# Patient Record
Sex: Female | Born: 1955 | Race: Black or African American | Hispanic: No | Smoking: Former smoker
Health system: Southern US, Community
[De-identification: ages and names within clinical notes are randomized; demographics above are authoritative.]

## PROBLEM LIST (undated history)

## (undated) DIAGNOSIS — M1990 Unspecified osteoarthritis, unspecified site: Secondary | ICD-10-CM

## (undated) HISTORY — DX: Unspecified osteoarthritis, unspecified site: M19.90

## (undated) HISTORY — PX: APPENDECTOMY: SHX54

## (undated) HISTORY — PX: CHOLECYSTECTOMY: SHX55

## (undated) HISTORY — PX: GASTRIC BYPASS: SHX52

---

## 1998-02-27 ENCOUNTER — Ambulatory Visit: Admission: RE | Admit: 1998-02-27 | Discharge: 1998-02-27 | Payer: Self-pay | Admitting: Internal Medicine

## 2001-06-21 ENCOUNTER — Encounter: Payer: Self-pay | Admitting: Internal Medicine

## 2001-06-21 ENCOUNTER — Ambulatory Visit (HOSPITAL_COMMUNITY): Admission: RE | Admit: 2001-06-21 | Discharge: 2001-06-21 | Payer: Self-pay | Admitting: Internal Medicine

## 2002-01-15 ENCOUNTER — Other Ambulatory Visit: Admission: RE | Admit: 2002-01-15 | Discharge: 2002-01-15 | Payer: Self-pay | Admitting: Internal Medicine

## 2002-12-24 ENCOUNTER — Ambulatory Visit (HOSPITAL_COMMUNITY): Admission: RE | Admit: 2002-12-24 | Discharge: 2002-12-24 | Payer: Self-pay | Admitting: Internal Medicine

## 2002-12-25 ENCOUNTER — Encounter: Payer: Self-pay | Admitting: Internal Medicine

## 2002-12-25 ENCOUNTER — Ambulatory Visit (HOSPITAL_COMMUNITY): Admission: RE | Admit: 2002-12-25 | Discharge: 2002-12-25 | Payer: Self-pay | Admitting: Internal Medicine

## 2003-01-08 ENCOUNTER — Encounter: Payer: Self-pay | Admitting: Internal Medicine

## 2003-01-08 ENCOUNTER — Ambulatory Visit (HOSPITAL_COMMUNITY): Admission: RE | Admit: 2003-01-08 | Discharge: 2003-01-08 | Payer: Self-pay | Admitting: Internal Medicine

## 2003-10-22 ENCOUNTER — Ambulatory Visit (HOSPITAL_COMMUNITY): Admission: RE | Admit: 2003-10-22 | Discharge: 2003-10-22 | Payer: Self-pay | Admitting: General Surgery

## 2003-11-03 ENCOUNTER — Encounter: Admission: RE | Admit: 2003-11-03 | Discharge: 2004-02-01 | Payer: Self-pay | Admitting: General Surgery

## 2003-11-05 ENCOUNTER — Encounter: Admission: RE | Admit: 2003-11-05 | Discharge: 2003-11-05 | Payer: Self-pay | Admitting: General Surgery

## 2003-11-12 ENCOUNTER — Encounter: Payer: Self-pay | Admitting: Cardiovascular Disease

## 2003-11-12 ENCOUNTER — Ambulatory Visit (HOSPITAL_COMMUNITY): Admission: RE | Admit: 2003-11-12 | Discharge: 2003-11-12 | Payer: Self-pay | Admitting: General Surgery

## 2004-02-25 ENCOUNTER — Encounter: Admission: RE | Admit: 2004-02-25 | Discharge: 2004-05-25 | Payer: Self-pay | Admitting: General Surgery

## 2004-05-10 ENCOUNTER — Encounter (INDEPENDENT_AMBULATORY_CARE_PROVIDER_SITE_OTHER): Payer: Self-pay | Admitting: Specialist

## 2004-05-10 ENCOUNTER — Inpatient Hospital Stay (HOSPITAL_COMMUNITY): Admission: RE | Admit: 2004-05-10 | Discharge: 2004-05-13 | Payer: Self-pay | Admitting: General Surgery

## 2004-08-05 ENCOUNTER — Encounter: Admission: RE | Admit: 2004-08-05 | Discharge: 2004-08-05 | Payer: Self-pay | Admitting: Internal Medicine

## 2004-10-13 ENCOUNTER — Encounter: Admission: RE | Admit: 2004-10-13 | Discharge: 2005-01-11 | Payer: Self-pay | Admitting: General Surgery

## 2004-11-02 ENCOUNTER — Ambulatory Visit: Admission: RE | Admit: 2004-11-02 | Discharge: 2004-11-25 | Payer: Self-pay | Admitting: Radiation Oncology

## 2004-12-15 ENCOUNTER — Ambulatory Visit: Admission: RE | Admit: 2004-12-15 | Discharge: 2004-12-15 | Payer: Self-pay | Admitting: Radiation Oncology

## 2007-05-16 ENCOUNTER — Ambulatory Visit (HOSPITAL_COMMUNITY): Admission: RE | Admit: 2007-05-16 | Discharge: 2007-05-16 | Payer: Self-pay | Admitting: Internal Medicine

## 2010-11-14 ENCOUNTER — Encounter: Payer: Self-pay | Admitting: General Surgery

## 2011-03-11 NOTE — Op Note (Signed)
NAME:  [name] Ray, [name] Ray                          ACCOUNT NO.:  [id number]   MEDICAL RECORD NO.:  [id number]                   PATIENT TYPE:  INP   LOCATION:  0164                                 FACILITY:  [facility]   PHYSICIAN:  Sandria Bales. Ezzard Standing, M.D.               DATE OF BIRTH:  [birth year]   DATE OF PROCEDURE:  05/10/2004  DATE OF DISCHARGE:                                 OPERATIVE REPORT   PREOPERATIVE DIAGNOSIS:  Morbid obesity, status post laparoscopic Roux-Y  gastrojejunostomy.   POSTOPERATIVE DIAGNOSIS:  Morbid obesity, status post laparoscopic Roux-Y  gastrojejunostomy normal anastomosis with no evidence of leak.   PROCEDURE:  Esophagogastroscopy.   SURGEON:  Dr. Ezzard Standing   FIRST ASSISTANT:  None.   ANESTHESIA:  General endotracheal.   INDICATION FOR PROCEDURE:  [name] is a 55 year old white female, who is  a patient of Dr. Jamse Mead, who has a weight of 351 pounds, a BMI of 53,  who now comes for a laparoscopic Roux-en-Y gastrojejunostomy.  Dr. Johna Sheriff  has completed the laparoscopic portion of this operation.  I am now doing an  upper endoscopy to document the patency anastomosis to evaluate that there  is no leak and no bleeding.   DESCRIPTION OF PROCEDURE:  While Dr. Johna Sheriff manned the laparoscope intra-  abdominally, I passed an Olympus endoscope through the patient's mouth  without difficulty, went down the esophagus into the gastric pouch.  The  anastomosis is at about 42-43 cm.  The Z-line is at about 37-38 cm for about  a 4-5 cm pouch.  While I was endoscoping the patient, I insufflated air.  He  had clamped off the small bowel, and there was no evidence of any leak.  I  then took a picture of the anastomosis.  Initially there was no bleeding.  I  irrigated out the stomach and then removed the endoscope, and the remainder  of the stomach pouch and the esophagus were otherwise unremarkable.   The patient tolerated the procedure well.  Dr. Johna Sheriff will dictate  the  remainder of this operation.                                               Sandria Bales. Ezzard Standing, M.D.    DHN/MEDQ  D:  05/10/2004  T:  05/10/2004  Job:  [id number]   cc:   Lorne Skeens. Hoxworth, M.D.  [address], [address]  [place]  [place] [address]  Fax: [phone]

## 2011-03-11 NOTE — Discharge Summary (Signed)
NAME:  Ray, [name]                          ACCOUNT NO.:  [id number]   MEDICAL RECORD NO.:  [id number]                   PATIENT TYPE:  INP   LOCATION:  0164                                 FACILITY:  [facility]   PHYSICIAN:  Sharlet Salina T. Hoxworth, M.D.          DATE OF BIRTH:  01/31/1956   DATE OF ADMISSION:  05/10/2004  DATE OF DISCHARGE:  05/13/2004                                 DISCHARGE SUMMARY   DISCHARGE DIAGNOSES:  1. Morbid obesity.  2. Cholelithiasis.   OPERATIONS/PROCEDURES:  1. Laparoscopic Roux-en-Y gastric bypass, May 10, 2004.  2. Laparoscopic cholecystectomy, May 10, 2004.   HISTORY OF PRESENT ILLNESS:  [name] Ray is a 55 year old black female with  longstanding morbid obesity unresponsive to medical management.  She has a  diagnosis of sleep apnea.  After extensive outpatient consultation and  workup, we have elected to proceed with laparoscopic Roux-en-Y gastric  bypass for treatment of obesity.  Preoperative workup also indicates  cholelithiasis and a cholecystectomy is planned.   PAST MEDICAL HISTORY:  Surgical history includes:  1. Appendectomy.  2. Tubal ligation.  She is followed for:  1. Elevated cholesterol, not on medications.  2. Uses CPAP intermittently for sleep apnea.  3. History of depression.   She is on Wellbutrin XL, as her only medication.   No known drug allergies.   Social history, family history, review of systems see H&P.   PERTINENT PHYSICAL EXAMINATION:  VITAL SIGNS:  Weight is 367 pounds, height  5'7, BMI 58, abdominal girth 137-cm.  Vital signs were unremarkable.  GENERAL:  Physical examination was remarkable only for morbid obesity.   HOSPITAL COURSE:  The patient was admitted on the morning of her procedures  and underwent an uneventful laparoscopic Roux-en-Y gastric bypass and  cholecystectomy.  Postoperatively, she did very well.  Gastrografin swallow  on the first postoperative day was negative for leak of obstruction.  Doppler exam of the lower extremities was negative.  She was begun on small  amounts of clear liquids which were tolerated well.  Was advanced to  unlimited clear liquids on the second postoperative day, also tolerated  well.  At this time, her white count is 11,000, hemoglobin was 11.8.  She  was advanced to a full liquid diet, on May 13, 2004, and tolerated this  without difficulty.  She is discharged home at this time.  Abdomen was  benign.  She has a small amount of serosanguineous JP drainage.   She is on a full liquid diet per nutrition instructions.   She was given a prescription for Roxicet elixir for pain.   Followup is in my office in one week.                                               Sharlet Salina  Lurlean Leyden, M.D.    Tory Emerald  D:  05/18/2004  T:  05/18/2004  Job:  [id number]

## 2011-03-11 NOTE — Op Note (Signed)
NAME:  [name] Ray, [name] Ray                          ACCOUNT NO.:  [id number]   MEDICAL RECORD NO.:  [id number]                   PATIENT TYPE:  INP   LOCATION:  0164                                 FACILITY:  [facility]   PHYSICIAN:  Sharlet Salina T. Hoxworth, M.D.          DATE OF BIRTH:  January 13, 1956   DATE OF PROCEDURE:  05/10/2004  DATE OF DISCHARGE:                                 OPERATIVE REPORT   PREOPERATIVE DIAGNOSES:  1. Morbid obesity.  2. Cholelithiasis.   SURGICAL PROCEDURES:  1. Laparoscopic Roux-Y gastric bypass.  2. Laparoscopic cholecystectomy with intraoperative cholangiogram.   SURGEON:  Sharlet Salina T. Hoxworth, M.D.   ASSISTANT:  Sandria Bales. Ezzard Standing, M.D.   ANESTHESIA:  General.   BRIEF HISTORY AND PHYSICAL:  [name] Ray is a 55 year old white female with  longstanding morbid obesity unresponsive to medical management.  She has  comorbidities of sleep apnea, hypercholesterolemia, and early hypertension.  After extensive discussion of options and preoperative workup, we have  elected to proceed with laparoscopic Roux-Y gastric bypass and treatment for  obesity.  On preoperative workup, she was also noted to have multiple  gallstones, and cholecystectomy with cholangiogram has been recommended and  accepted.  The patient is now brought to the operating room for this  procedure.   DESCRIPTION OF OPERATION:  The patient was brought to the operating room and  placed in the supine position on the operating table.  General endotracheal  anesthesia was induced.  The PS's were in place.  She received perioperative  antibiotics and subcutaneous Lovenox 40 mg.  She had undergone a mechanical  antibiotic bowel prep preoperatively.  The abdomen was widely sterilely  prepped and draped.  Local anesthesia was used to infiltrate the trocar  sites prior to the incisions.  Access was obtained through a 12-mm port at  the left subcostal margin using the Optivue trocar without difficulty, and  pneumoperitoneum established.  Under direct vision, a 12-mm port was placed  in the right upper quadrant through the falciform ligament, the right  midabdomen, and the left midabdomen near the midline, and a 5-mm port was  placed in the left flank.  The omentum and transverse mesocolon were  elevated and the ligament of Treitz identified.  A 40-cm afferent limb was  measured.  At this point, the bowel was divided with a single firing of the  Endo GIA vascular stapler.  The mesentery was further mobilized with the  Harmonic scalpel.  A Penrose drain was sutured to the end of the efferent  limb for identification.  A 100-cm efferent limb was then measured.  At this  point, a jejunojejunostomy was created making enterotomies with the Harmonic  scalpel and using a single firing of the Endo GIA vascular stapler.  The  staple line was inspected and was without bleeding and intact.  The common  enterotomy was then closed with running 2-0 Vicryl begun at either end of  the enterotomy and tied centrally.  The mesenteric defect behind the  afferent limb was then exposed, and this was closed with a running 2-0 silk  suture working out onto the jejunojejunostomy with a nice closure of the  mesenteric defect.  Tisseal tissue seal was then placed over the staple and  suture lines.  Following this, the patient was placed in the steep reverse  Trendelenburg and through a 5-mm trocar site, the [facility] liver retractor  placed and the left lobe of the liver elevated.  The hiatus and upper  stomach were well visualized.  The peritoneum overlying the angle of His was  incised, and the angle of His was mobilized down along the left crus toward  the lesser sac.  All tubes were then removed from the stomach.  A 4-5 cm  pouch was then measured along the lesser curve, and at this point, the  peritoneum along the lesser curve was incised, and dissection was carried  along right angles close to the stomach wall  toward the lesser sac, and the  lesser sac entered with minimal dissection.  An initial firing of the Endo  GIA blue load stapler was performed at right angles across the stomach at  this point.  A tubular pouch was then created with three more firings of the  stapler to and through the angle of His with the last firing being performed  with the Ewald tube through the EG junction and freely movable.  The pouch  was seen to be completely freed from the remnant.  Tisseal tissue seal was  placed on the upper staple line at the pouch near the angle of His and along  the remnant staple line.  The efferent limb was then brought up for  anastomosis and came up nicely in an antecolic position without any undue  tension.  An anastomosis was created between the efferent limb and the  gastric pouch with an initial running posterior row of 2-0 Vicryl.  With the  Ewald tube advanced back into the pouch, enterotomies were created in the  pouch and in the efferent limb with the Harmonic scalpel and an anastomosis  created approximately 2 cm in length with a single firing of the Endo GIA  blue load stapler.  The staple line was inspected, appeared intact, and  without bleeding.  The common enterotomy was then closed from either end  with running 2-0 Vicryl.  The Ewald tube was then passed through the  anastomosis into the efferent limb, and an outer anterior seromuscular row  of 2-0 Vicryl was placed.  Following this, endoscopy was performed by Dr.  Ezzard Standing with the efferent limb clamped near the pouch, and with the pouch  under saline, and with it tightly distended with air, there was no evidence  of leak.  The air was evacuated and saline suctioned.  Tisseal tissue seal  was placed over the staple and suture lines around the gastrojejunostomy.  Attention at this point was turned to the cholecystectomy.  The epigastric  port was redirected to the right of the falciform ligament.  Two additional 5-mm  trocars were placed on the right subcostal margin.  The gallbladder was  identified and the fundus elevated up over the liver and the infundibulum  retracted inferolaterally.  The fibrofatty tissue was stripped off the neck  of the gallbladder toward the porta hepatis, and the peritoneum anterior and  posterior to Calot's triangle was incised.  The distal gallbladder was  thoroughly dissected,  and Calot's triangle skeletonized.  The cystic artery  was clearly identified coursing over the gallbladder wall.  It was divided  between two proximal and one distal clip.  The cystic duct/gallbladder  junction was dissected 360 degrees.  The cystic duct was then clipped at the  gallbladder junction.  Operative cholangiogram was obtained through the  cystic duct which showed good filling of a normal common bile duct and  intrahepatic ducts and free flow into the duodenum with no filling defects.  Following this, the catheter was removed.  The cystic duct was doubly  clipped proximally and divided.  The gallbladder was then dissected free  from its bed using Hook cautery.  It was placed in an EndoCatch bag and then  brought up to one of the 12-mm trocar sites where the contents were removed  and the gallbladder removed.  The abdomen was irrigated and complete  hemostasis assured.  A closed suction drain was left, brought out through  the lateral 5-mm trocar site and placed subhepatic near the  gastrojejunostomy.  Trocars were then removed and all CO2 evacuated.  Skin  incisions were closed with staples.  Sponge, needle, and instruments counts  were correct.  Dry sterile dressings were applied, and the patient was taken  to the recovery room in good condition having tolerated the procedure well.                                               Lorne Skeens. Hoxworth, M.D.    Tory Emerald  D:  05/10/2004  T:  05/10/2004  Job:  [id number]

## 2013-01-05 ENCOUNTER — Ambulatory Visit (INDEPENDENT_AMBULATORY_CARE_PROVIDER_SITE_OTHER): Payer: BC Managed Care – PPO | Admitting: Emergency Medicine

## 2013-01-05 VITALS — BP 162/82 | HR 110 | Temp 98.5°F | Resp 17 | Ht 65.5 in | Wt 315.0 lb

## 2013-01-05 DIAGNOSIS — R21 Rash and other nonspecific skin eruption: Secondary | ICD-10-CM

## 2013-01-05 MED ORDER — TRIAMCINOLONE ACETONIDE 0.1 % EX CREA: TOPICAL_CREAM | Freq: Two times a day (BID) | CUTANEOUS | Status: DC

## 2013-01-05 NOTE — Progress Notes (Signed)
[facility] [address] [place] [address] [phone]  Date:  01/05/2013   Name:  [name] Ray   DOB:  1956-01-20   MRN:  [id number]  PCP:  Alva Garnet., MD    Chief Complaint: Hand Injury   History of Present Illness:  [name] Ray is a 57 y.o. very pleasant female patient who presents with the following:  Rash on 2nd and 3rd MCP on extensor surface for the past month or so.  No history of injury or new allergen contact.  Pruritic.  No improvement with topical home remedies.  Forms keloids.  No history of psoriasis.  No improvement with over the counter medications or other home remedies. Denies other complaint or health concern today.    There is no problem list on file for this patient.   History reviewed. No pertinent past medical history.  Past Surgical History  Procedure Laterality Date  . Appendectomy    . Cholecystectomy      History  Substance Use Topics  . Smoking status: Current Every Day Smoker -- 0.50 packs/day for 20 years    Types: Cigarettes  . Smokeless tobacco: Not on file  . Alcohol Use: 0.6 oz/week    1 Glasses of wine per week    Family History  Problem Relation Age of Onset  . Lupus Sister     Not on File  Medication list has been reviewed and updated.  No current outpatient prescriptions on file prior to visit.   No current facility-administered medications on file prior to visit.    Review of Systems:  As per HPI, otherwise negative.    Physical Examination: Filed Vitals:   01/05/13 1737  BP: 162/82  Pulse: 110  Temp: 98.5 F (36.9 C)  Resp: 17   Filed Vitals:   01/05/13 1737  Height: 5' 5.5" (1.664 m)  Weight: 315 lb (142.883 kg)   Body mass index is 51.6 kg/(m^2). Ideal Body Weight: Weight in (lb) to have BMI = 25: 152.2   GEN: WDWN, NAD, Non-toxic, Alert & Oriented x 3 HEENT: Atraumatic, Normocephalic.  Ears and Nose: No external deformity. EXTR: No  clubbing/cyanosis/edema NEURO: Normal gait.  PSYCH: Normally interactive. Conversant. Not depressed or anxious appearing.  Calm demeanor.  SKIN:  Scaly eruption on second and third MCP joints left hand  No erythema or cellulitis or lymphangitis.  No involvement of joints.  Assessment and Plan: Possible psoriasis TAC Follow up with dermatology if no improvement in 2 weeks  Carmelina Dane, MD

## 2013-01-05 NOTE — Patient Instructions (Addendum)
Psoriasis Psoriasis is a common, long-lasting (chronic) inflammation of the skin. It affects both men and women equally, of all ages and all races. Psoriasis cannot be passed from person to person (not contagious). Psoriasis varies from mild to very severe. When severe, it can greatly affect your quality of life. Psoriasis is an inflammatory disorder affecting the skin as well as other organs including the joints (causing an arthritis). With psoriasis, the skin sheds its top layer of cells more rapidly than it does in someone without psoriasis. CAUSES  The cause of psoriasis is largely unknown. Genetics, your immune system, and the environment seem to play a role in causing psoriasis. Factors that can make psoriasis worse include:  Damage or trauma to the skin, such as cuts, scrapes, and sunburn. This damage often causes new areas of psoriasis (lesions).  Winter dryness and lack of sunlight.  Medicines such as lithium, beta-blockers, antimalarial drugs, ACE inhibitors, nonsteroidal anti-inflammatory drugs (ibuprofen, aspirin), and terbinafine. Let your caregiver know if you are taking any of these drugs.  Alcohol. Excessive alcohol use should be avoided if you have psoriasis. Drinking large amounts of alcohol can affect:  How well your psoriasis treatment works.  How safe your psoriasis treatment is.  Smoking. If you smoke, ask your caregiver for help to quit.  Stress.  Bacterial or viral infections.  Arthritis. Arthritis associated with psoriasis (psoriatic arthritis) affects less than 10% of patients with psoriasis. The arthritic intensity does not always match the skin psoriasis intensity. It is important to let your caregiver know if your joints hurt or if they are stiff. SYMPTOMS  The most common form of psoriasis begins with little red bumps that gradually become larger. The bumps begin to form scales that flake off easily. The lower layers of scales stick together. When these scales  are scratched or removed, the underlying skin is tender and bleeds easily. These areas then grow in size and may become large. Psoriasis often creates a rash that looks the same on both sides of the body (symmetrical). It often affects the elbows, knees, groin, genitals, arms, legs, scalp, and nails. Affected nails often have pitting, loosen, thicken, crumble, and are difficult to treat.  "Inverse psoriasis"occurs in the armpits, under breasts, in skin folds, and around the groin, buttocks, and genitals.  "Guttate psoriasis" generally occurs in children and young adults following a recent sore throat (strep throat). It begins with many small, red, scaly spots on the skin. It clears spontaneously in weeks or a few months without treatment. DIAGNOSIS  Psoriasis is diagnosed by physical exam. A tissue sample (biopsy) may also be taken. TREATMENT The treatment of psoriasis depends on your age, health, and living conditions.  Steroid (cortisone) creams, lotions, and ointments may be used. These treatments are associated with thinning of the skin, blood vessels that get larger (dilated), loss of skin pigmentation, and easy bruising. It is important to use these steroids as directed by your caregiver. Only treat the affected areas and not the normal, unaffected skin. People on long-term steroid treatment should wear a medical alert bracelet. Injections may be used in areas that are difficult to treat.  Scalp treatments are available as shampoos, solutions, sprays, foams, and oils. Avoid scratching the scalp and picking at the scales.  Anthralin medicine works well on areas that are difficult to treat. However, it stains clothes and skin and may cause temporary irritation.  Synthetic vitamin D (calcipotriene)can be used on small areas. It is available by prescription. The forms   of synthetic vitamin D available in health food stores do not help with psoriasis.  Coal tarsare available in various strengths  for psoriasis that is difficult to treat. They are one of the longest used treatments for difficult to treat psoriasis. However, they are messy to use.  Light therapy (UV therapy) can be carefully and professionally monitored in a dermatologist's office. Careful sunbathing is helpful for many people as directed by your caregiver. The exposure should be just long enough to cause a mild redness (erythema) of your skin. Avoid sunburn as this may make the condition worse. Sunscreen (SPF of 30 or higher) should be used to protect against sunburn. Cataracts, wrinkles, and skin aging are some of the harmful side effects of light therapy.  If creams (topical medicines) fail, there are several other options for systemic or oral medicines your caregiver can suggest. Psoriasis can sometimes be very difficult to treat. It can come and go. It is necessary to follow up with your caregiver regularly if your psoriasis is difficult to treat. Usually, with persistence you can get a good amount of relief. Maintaining consistent care is important. Do not change caregivers just because you do not see immediate results. It may take several trials to find the right combination of treatment for you. PREVENTING FLARE-UPS  Wear gloves while you wash dishes, while cleaning, and when you are outside in the cold.  If you have radiators, place a bowl of water or damp towel on the radiator. This will help put water back in the air. You can also use a humidifier to keep the air moist. Try to keep the humidity at about 60% in your home.  Apply moisturizer while your skin is still damp from bathing or showering. This traps water in the skin.  Avoid long, hot baths or showers. Keep soap use to a minimum. Soaps dry out the skin and wash away the protective oils. Use a fragrance free, dye free soap.  Drink enough water and fluids to keep your urine clear or pale yellow. Not drinking enough water depletes your skin's water  supply.  Turn off the heat at night and keep it low during the day. Cool air is less drying. SEEK MEDICAL CARE IF:  You have increasing pain in the affected areas.  You have uncontrolled bleeding in the affected areas.  You have increasing redness or warmth in the affected areas.  You start to have pain or stiffness in your joints.  You start feeling depressed about your condition.  You have a fever. Document Released: 10/07/2000 Document Revised: 01/02/2012 Document Reviewed: 04/04/2011 [employer] Patient Information 2013 [employer].  

## 2016-01-28 ENCOUNTER — Ambulatory Visit (INDEPENDENT_AMBULATORY_CARE_PROVIDER_SITE_OTHER): Payer: BLUE CROSS/BLUE SHIELD | Admitting: Physician Assistant

## 2016-01-28 VITALS — BP 113/77 | HR 83 | Temp 98.0°F | Resp 16 | Ht 65.5 in | Wt 322.0 lb

## 2016-01-28 DIAGNOSIS — Z720 Tobacco use: Secondary | ICD-10-CM

## 2016-01-28 DIAGNOSIS — Z124 Encounter for screening for malignant neoplasm of cervix: Secondary | ICD-10-CM

## 2016-01-28 DIAGNOSIS — Z111 Encounter for screening for respiratory tuberculosis: Secondary | ICD-10-CM

## 2016-01-28 DIAGNOSIS — Z1239 Encounter for other screening for malignant neoplasm of breast: Secondary | ICD-10-CM

## 2016-01-28 DIAGNOSIS — Z0000 Encounter for general adult medical examination without abnormal findings: Secondary | ICD-10-CM | POA: Diagnosis not present

## 2016-01-28 DIAGNOSIS — Z9889 Other specified postprocedural states: Secondary | ICD-10-CM | POA: Diagnosis not present

## 2016-01-28 DIAGNOSIS — Z0184 Encounter for antibody response examination: Secondary | ICD-10-CM

## 2016-01-28 DIAGNOSIS — Z23 Encounter for immunization: Secondary | ICD-10-CM | POA: Diagnosis not present

## 2016-01-28 DIAGNOSIS — Z9884 Bariatric surgery status: Secondary | ICD-10-CM | POA: Insufficient documentation

## 2016-01-28 LAB — HEPATITIS B SURFACE ANTIBODY, QUANTITATIVE: HEPATITIS B-POST: 0.7 m[IU]/mL

## 2016-01-28 LAB — VITAMIN B12

## 2016-01-28 NOTE — Progress Notes (Signed)
[facility] [address], [place] [address] [phone]  Date:  01/28/2016   Name:  [name] InfanteBetty E Ray   DOB:  May 26, 1956   MRN:  [id number]  PCP:  No primary care provider on file.    Chief Complaint: Annual Exam   History of Present Illness:  This is a 60 y.o. female with PMH morbid obesity, tobacco abuse who is presenting for CPE.   Takes phentermine 37.5 mg daily. Gets from [facility] Mayford Ray [facility]. Has been taking for 2 weeks now. Has lost 6 pounds so far. States she had blood work 2 weeks ago - CMP, CBC, TSH normal, lipid (states HDL was around 45 and LDL around 140). States she wants a vit D, b12 and A1c today.  Smokes 2-3 cigarettes a day. States she is always trying to quit. Only smokes in her bathroom. Never smokes on her 12 hour shift at work.  Had gastric bypass in 2006. Has had cholecystectomy and appendectomy in the past.  Last pap: 2012, never had abnormals. Does want to get a pap smear today. Sexual history: not currently sexually active. Does not want STD testing today. Immunizations: flu 06/2015 Dentist: has dentures. Doesn't go to dentist often unless problems with dentures. Eye: does not wear glasses or contacts. Left eye has been more blurry recently. She plans to make an eye appt soon. Diet/Exercise: exercises 1-2 times a week. Has a treadmill and weight machine at her house. Exercises in 10 minute blocks of time. Fam hx: Dad with DM and heart disease, mother with HTN, sister with hx cervical cancer. No other cancers in family. Tobacco/alcohol/substance use: 2-3 cigarettes per day/1-2 drinks per week/no Mammogram: 2012, normal. 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 She is needing forms filled out and labs for NP program.  Review of Systems:  Review of Systems  Constitutional: Negative.   HENT: Negative.   Eyes: Negative.   Respiratory: Negative.   Cardiovascular: Negative.    Gastrointestinal: Negative.   Endocrine: Negative.   Genitourinary: Negative.   Musculoskeletal: Positive for arthralgias.  Skin: Negative.   Allergic/Immunologic: Negative.   Neurological: Negative.   Hematological: Negative.   Psychiatric/Behavioral: Negative.    There are no active problems to display for this patient.   Prior to Admission medications   Medication Sig Start Date End Date Taking? Authorizing Provider  phentermine 37.5 MG capsule Take 37.5 mg by mouth every morning.   Yes Historical Provider, MD    No Known Allergies  Past Surgical History  Procedure Laterality Date  . Appendectomy    . Cholecystectomy      Social History  Substance Use Topics  . Smoking status: Current Every Day Smoker -- 0.50 packs/day for 20 years    Types: Cigarettes  . Smokeless tobacco: None  . Alcohol Use: 0.6 oz/week    1 Glasses of wine per week    Family History  Problem Relation Age of Onset  . Lupus Sister     Medication list has been reviewed and updated.  Physical Examination:  Physical Exam  Constitutional: She is oriented to person, place, and time.  HENT:  Head: Normocephalic and atraumatic.  Right Ear: Hearing, tympanic membrane, external ear and ear canal normal.  Left Ear: Hearing, tympanic membrane, external ear and ear canal normal.  Nose: Nose normal.  Mouth/Throat: Uvula is midline, oropharynx is clear and moist and mucous membranes are normal.  Eyes: Conjunctivae, EOM and lids are normal. Right eye exhibits no discharge. Left eye exhibits no discharge. No scleral icterus.  Neck: Trachea normal. Carotid bruit is not present. No thyromegaly present.  Cardiovascular: Normal rate, regular rhythm, normal heart sounds, intact distal pulses and normal pulses.   No murmur heard. Pulmonary/Chest: Effort normal and breath sounds normal. She has no wheezes. She has no rhonchi. She has no rales.  Breast exam declined  Abdominal: Soft. Normal appearance. There  is no tenderness.  Genitourinary: Vagina normal. There is no lesion on the right labia. There is no lesion on the left labia. Cervix exhibits no motion tenderness, no discharge and no friability. Right adnexum displays no tenderness. Left adnexum displays no tenderness. No vaginal discharge found.  Size of uterus unable to be determined d/t abdominal obesity  Musculoskeletal: Normal range of motion.  Lymphadenopathy:       Head (right side): No submental, no submandibular and no tonsillar adenopathy present.       Head (left side): No submental, no submandibular and no tonsillar adenopathy present.    She has no cervical adenopathy.       Right: No supraclavicular adenopathy present.       Left: No supraclavicular adenopathy present.  Neurological: She is alert and oriented to person, place, and time. Coordination and gait normal.  Skin: Skin is warm, dry and intact. No lesion and no rash noted.  Psychiatric: She has a normal mood and affect. Her speech is normal and behavior is normal. Thought content normal.   BP 113/77 mmHg  Pulse 83  Temp(Src) 98 F (36.7 C) (Oral)  Resp 16  Ht 5' 5.5" (1.664 m)  Wt 322 lb (146.058 kg)  BMI 52.75 kg/m2   Visual Acuity Screening   Right eye Left eye Both eyes  Without correction:  With correction:      Assessment and Plan:  1. Annual physical exam Pt not utd on several preventative screenings. She will make appt for mammogram asap. utd on colonoscopy utd on immunizations now  2. Morbid obesity, unspecified obesity type (HCC) Morbidly obese. S/p gastric bypass in 2006. She just started on phentermine 2 weeks ago but still not exercising. We discussed diet and exercise to facilitate more wt loss with phentermine.  - Hemoglobin A1c  3. History of gastric bypass - Vitamin B12 - VITAMIN D 25 Hydroxy (Vit-D Deficiency, Fractures)  4. Screening-pulmonary TB - TB Skin Test  5. Need for Tdap vaccination - Tdap vaccine  greater than or equal to 7yo IM  6. Immunity status testing Once labs final, will fill out forms for NP program and have her come pick up. - Hepatitis B surface antibody - Measles/Mumps/Rubella Immunity - Varicella zoster antibody, IgG  7. Cervical cancer screening - Pap IG w/ reflex to HPV when ASC-U  8. Breast cancer screening Gave contact information for pt to schedule mammogram.  9. Tobacco abuse We discussed cessation. She is going to try a vape pen - recent studies have shown vape pens are 95% safer than cigarettes. We discussed if she tries this, she would need to have goal of quitting completely.   [place] Dyke Brackett, MHS [facility]  01/28/2016

## 2016-01-28 NOTE — Progress Notes (Signed)

## 2016-01-28 NOTE — Patient Instructions (Addendum)
We recommend that you schedule a mammogram for breast cancer screening. Typically, you do not need a referral to do this. Please contact a local imaging center to schedule your mammogram.  [facility] - [phone]  *ask for the Radiology Department [facility] - [phone] or [phone]  [facility] - [phone]) [phone] [facility] - [phone] [facility] - [phone]) [phone]  *ask for the Radiology Department [facility] - [phone]  *ask for the Radiology Department [facility] - [phone]  *ask for the Mammography Department [facility] - [phone]  Make eye appt soon.  i will call you with your lab results. I will fill out your forms and let you know when ready for pick up.  Start exercising regularly, at least 4 times a week for at least 30 minutes at a time. Work on diet - no sugary beverages. Limit carbs (pasta, bread, rice, potatoes). Avoid sweets. Eat lean meats ([place], fish, chicken), plenty of fruits and vegetables. Making these changes will give you better results with phentermine.  STOP SMOKING!!!!!!!!!        IF you received an x-ray today, you will receive an invoice from [facility]. Please contact [facility] at [phone] with questions or concerns regarding your invoice.   IF you received labwork today, you will receive an invoice from [facility]. Please contact [facility] at [phone] with questions or concerns regarding your invoice.   Our billing staff will not be able to assist you with questions regarding bills from these companies.  You will be contacted with the lab results as soon as they are available. The fastest way to get your results is to activate your My Chart account. Instructions are located on the last page of this paperwork. If you have not heard from us regarding the results in 2  weeks, please contact this office.

## 2016-01-29 LAB — PAP IG W/ RFLX HPV ASCU

## 2016-01-29 LAB — MEASLES/MUMPS/RUBELLA IMMUNITY
Mumps IgG: 12.8 AU/mL — ABNORMAL HIGH (ref <9.00)
RUBELLA: 22.1 {index} — AB (ref <0.90)

## 2016-01-29 LAB — VARICELLA ZOSTER ANTIBODY, IGG: VARICELLA IGG: 2352 {index} — AB (ref <135.00)

## 2016-01-29 LAB — HEMOGLOBIN A1C
Hgb A1c MFr Bld: 6.2 % — ABNORMAL HIGH (ref <5.7)
Mean Plasma Glucose: 131 mg/dL

## 2016-01-29 LAB — VITAMIN D 25 HYDROXY (VIT D DEFICIENCY, FRACTURES): Vit D, 25-Hydroxy: 31 ng/mL (ref 30–100)

## 2016-01-31 ENCOUNTER — Ambulatory Visit (INDEPENDENT_AMBULATORY_CARE_PROVIDER_SITE_OTHER): Payer: BLUE CROSS/BLUE SHIELD | Admitting: *Deleted

## 2016-01-31 DIAGNOSIS — Z111 Encounter for screening for respiratory tuberculosis: Secondary | ICD-10-CM

## 2016-01-31 DIAGNOSIS — Z7689 Persons encountering health services in other specified circumstances: Secondary | ICD-10-CM

## 2016-01-31 LAB — TB SKIN TEST
INDURATION: 0 mm
TB Skin Test: NEGATIVE

## 2016-02-01 NOTE — Progress Notes (Signed)
Pt was not immune to Hep B. She will return to pick up paperwork and get her first Hep B vaccine. REturn in 1 month for 2nd and 6 months for 3rd. She was made aware that paperwork ready for pick up.

## 2016-02-01 NOTE — Addendum Note (Signed)
Addended by: Dorna LeitzBUSH, NICOLE V on: 02/01/2016 07:48 PM   Modules accepted: Orders, SmartSet

## 2016-02-02 ENCOUNTER — Ambulatory Visit: Payer: BLUE CROSS/BLUE SHIELD

## 2017-04-04 ENCOUNTER — Encounter: Payer: Self-pay | Admitting: Student

## 2017-04-04 ENCOUNTER — Ambulatory Visit (INDEPENDENT_AMBULATORY_CARE_PROVIDER_SITE_OTHER): Payer: No Typology Code available for payment source | Admitting: Student

## 2017-04-04 DIAGNOSIS — H527 Unspecified disorder of refraction: Secondary | ICD-10-CM

## 2017-04-04 DIAGNOSIS — Z9884 Bariatric surgery status: Secondary | ICD-10-CM

## 2017-04-04 DIAGNOSIS — I10 Essential (primary) hypertension: Secondary | ICD-10-CM

## 2017-04-04 LAB — POCT URINALYSIS DIP (MANUAL ENTRY)
Blood, UA: NEGATIVE
GLUCOSE UA: NEGATIVE mg/dL
LEUKOCYTES UA: NEGATIVE
Nitrite, UA: NEGATIVE
Spec Grav, UA: 1.025 (ref 1.010–1.025)
UROBILINOGEN UA: 1 U/dL
pH, UA: 6 (ref 5.0–8.0)

## 2017-04-04 LAB — POCT CBC
Granulocyte percent: 57.7 %G (ref 37–80)
HEMATOCRIT: 32.1 % — AB (ref 37.7–47.9)
Hemoglobin: 11.1 g/dL — AB (ref 12.2–16.2)
LYMPH, POC: 4.4 — AB (ref 0.6–3.4)
MCH, POC: 26.9 pg — AB (ref 27–31.2)
MCHC: 34.7 g/dL (ref 31.8–35.4)
MCV: 77.5 fL — AB (ref 80–97)
MID (CBC): 0.4 (ref 0–0.9)
MPV: 8.4 fL (ref 0–99.8)
PLATELET COUNT, POC: 331 10*3/uL (ref 142–424)
POC GRANULOCYTE: 6.6 (ref 2–6.9)
POC LYMPH %: 38.4 % (ref 10–50)
POC MID %: 3.9 %M (ref 0–12)
RBC: 4.14 M/uL (ref 4.04–5.48)
RDW, POC: 15.9 %
WBC: 11.5 10*3/uL — AB (ref 4.6–10.2)

## 2017-04-04 NOTE — Patient Instructions (Signed)
     IF you received an x-ray today, you will receive an invoice from [facility]. Please contact [facility] at [phone] with questions or concerns regarding your invoice.   IF you received labwork today, you will receive an invoice from [facility]. Please contact [facility] at [phone] with questions or concerns regarding your invoice.   Our billing staff will not be able to assist you with questions regarding bills from these companies.  You will be contacted with the lab results as soon as they are available. The fastest way to get your results is to activate your My Chart account. Instructions are located on the last page of this paperwork. If you have not heard from us regarding the results in 2 weeks, please contact this office.     

## 2017-04-04 NOTE — Progress Notes (Signed)
   Subjective:    Patient ID: [name] Ray, female    DOB: Mar 11, 1956, 61 y.o.   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  She denies any current medical problems.  She has a history of obesity and gastric bypass.  She is trying to lose weight.  She wants to try a ketogenic diet.   She reports that she has stopped smoking and is only vaping once a day.  She denies alcohol use. She had titers drawn last year and was immune to MMR, varicella, not to hep B.  Needs UA and CBC for school.  States that she already gave school PPD. Last pap smear last year was normal.     PMHx - Updated and reviewed.  Contributory factors include: Obesity PSHx - Updated and reviewed.  Contributory factors include:  Gastric bypass FHx - Updated and reviewed.  Contributory factors include:  Negative Social Hx - Updated and reviewed. Contributory factors include: Negative Medications - reviewed    Review of Systems  Constitutional: Negative for chills, fatigue and fever.  HENT: Negative for congestion and rhinorrhea.   Respiratory: Negative for cough, chest tightness and shortness of breath.   Cardiovascular: Negative for chest pain, palpitations and leg swelling.  Gastrointestinal: Negative for abdominal pain and nausea.  Genitourinary: Negative for dysuria and urgency.  Musculoskeletal: Negative for arthralgias and joint swelling.  Skin: Negative for rash and wound.  Psychiatric/Behavioral: Negative for agitation and confusion.  All other systems reviewed and are negative.      Objective:   Physical Exam  Constitutional: She is oriented to person, place, and time. She appears well-developed and well-nourished. No distress.  obese  HENT:  Head: Normocephalic and atraumatic.  Right Ear: External ear normal.  Left Ear: External ear normal.  Neck: Normal range of motion. Neck supple.  Cardiovascular: Normal rate,  regular rhythm, normal heart sounds and intact distal pulses.  Exam reveals no gallop and no friction rub.   No murmur heard. Pulmonary/Chest: Effort normal and breath sounds normal. No respiratory distress. She has no wheezes. She has no rales. She exhibits no tenderness.  Musculoskeletal: Normal range of motion. She exhibits no edema.  Neurological: She is alert and oriented to person, place, and time.  Skin: Skin is warm. No rash noted. She is not diaphoretic. No erythema.  Psychiatric: She has a normal mood and affect. Her behavior is normal. Judgment and thought content normal.  Nursing note and vitals reviewed.  BP (!) 143/75 (BP Location: Right Arm, Patient Position: Sitting)   Pulse 76   Temp 98.2 F (36.8 C) (Oral)   Resp 16   Ht 5' 5.75" (1.67 m)   Wt (!) 336 lb (152.4 kg)   SpO2 100%   BMI 54.65 kg/m         Assessment & Plan:  Morbid obesity ([place]) Recommend diet/exercise. CBC shows anemia- recommend iron supplementation.  Follow up yearly.  Refractive error Seen on exam today- optometrist referral placed.  Elevated blood pressure reading in office with diagnosis of hypertension Recommend repeat BP in 2 weeks, labwork and HTN medications if still elevated.    Signed,  Balinda Quails, DO [facility] 8:20 PM

## 2017-04-04 NOTE — Progress Notes (Deleted)
   Subjective:    Patient ID: [name] Ray, female    DOB: 04/08/1956, 61 y.o.   MRN: [id number]  HPI Trying to lose weight, wants to try keto.    Stopped smoking for 1 year, now is doing vaping.    PMHx - Updated and reviewed.  Contributory factors include: obesity PSHx - Updated and reviewed.  Contributory factors include:  Negative FHx - Updated and reviewed.  Contributory factors include:  Negative Social Hx - Updated and reviewed. Contributory factors include: Negative Medications - reviewed   Review of Systems     Objective:   Physical Exam BP (!) 154/83 (BP Location: Right Arm, Patient Position: Sitting, Cuff Size: Large)   Pulse 76   Temp 98.2 F (36.8 C) (Oral)   Resp 16   Ht 5' 5.75" (1.67 m)   Wt (!) 336 lb (152.4 kg)   SpO2 100%   BMI 54.65 kg/m         Assessment & Plan:

## 2017-04-05 DIAGNOSIS — I10 Essential (primary) hypertension: Secondary | ICD-10-CM | POA: Insufficient documentation

## 2017-04-05 DIAGNOSIS — H527 Unspecified disorder of refraction: Secondary | ICD-10-CM | POA: Insufficient documentation

## 2017-04-05 NOTE — Assessment & Plan Note (Signed)
Recommend repeat BP in 2 weeks, labwork and HTN medications if still elevated.

## 2017-04-05 NOTE — Assessment & Plan Note (Signed)
Seen on exam today- optometrist referral placed.

## 2017-04-05 NOTE — Assessment & Plan Note (Signed)
Recommend diet/exercise. CBC shows anemia- recommend iron supplementation.  Follow up yearly.

## 2020-04-09 ENCOUNTER — Ambulatory Visit: Payer: Self-pay | Admitting: Internal Medicine

## 2020-05-07 ENCOUNTER — Other Ambulatory Visit: Payer: Self-pay

## 2020-05-07 ENCOUNTER — Telehealth (INDEPENDENT_AMBULATORY_CARE_PROVIDER_SITE_OTHER): Payer: Self-pay | Admitting: Internal Medicine

## 2020-05-07 ENCOUNTER — Encounter: Payer: Self-pay | Admitting: Internal Medicine

## 2020-05-07 DIAGNOSIS — Z9884 Bariatric surgery status: Secondary | ICD-10-CM

## 2020-05-07 DIAGNOSIS — Z7689 Persons encountering health services in other specified circumstances: Secondary | ICD-10-CM

## 2020-05-07 DIAGNOSIS — E669 Obesity, unspecified: Secondary | ICD-10-CM

## 2020-05-07 DIAGNOSIS — F329 Major depressive disorder, single episode, unspecified: Secondary | ICD-10-CM

## 2020-05-07 DIAGNOSIS — R7303 Prediabetes: Secondary | ICD-10-CM

## 2020-05-07 DIAGNOSIS — F32A Depression, unspecified: Secondary | ICD-10-CM

## 2020-05-07 NOTE — Progress Notes (Signed)
Virtual Visit via Telephone Note  I connected with MONROE TOURE, on 05/07/2020 at 11:01 AM by telephone due to the COVID-19 pandemic and verified that I am speaking with the correct person using two identifiers.   Consent: I discussed the limitations, risks, security and privacy concerns of performing an evaluation and management service by telephone and the availability of in person appointments. I also discussed with the patient that there may be a patient responsible charge related to this service. The patient expressed understanding and agreed to proceed.   Location of Patient: Home   Location of Provider: Clinic    Persons participating in Telemedicine visit: LATONYA NELON [facility] Dr. Earlene Plater    History of Present Illness: Patient has a visit to establish care. Patient reports no significant PMH other than obesity. Says she does need to have an annual exam. Past surgical history of cholecystectomy and appendectomy.   Reports history of gastric bypass at least 8 years ago. Had this procedure done with [facility]. Reports that she gained most of this back. Has had intermittent good exercise habits.   She wants to speak to a counselor about concerns about depression. Says she was on Wellbutrin in the past.    Past Medical History:  Diagnosis Date  . Arthritis    No Known Allergies  No current outpatient medications on file prior to visit.   No current facility-administered medications on file prior to visit.    Observations/Objective: NAD. Speaking clearly.  Work of breathing normal.  Alert and oriented. Mood appropriate.   Assessment and Plan: 1. Encounter to establish care Reviewed patient's PMH, social history, surgical history, and medications.  Is overdue for annual exam, screening blood work, and health maintenance topics. Have asked patient to return for visit to address these items.   2. Prediabetes Reviewed that patient has a history of  prediabetes. Last A1c in EMR 6.2 from 2017. Thinks it was borderline with prior PCP at outside facility within the past year. Will plan to screen.   3. History of gastric bypass 4. Obesity, unspecified classification, unspecified obesity type, unspecified whether serious comorbidity present - Amb Ref to Medical Weight Management  5. Depression, unspecified depression type Will plan to check PHQ-9 at upcoming visit. Discuss potential for starting medication pending further screening.  - Ambulatory referral to Psychology   Follow Up Instructions: Annual exam    I discussed the assessment and treatment plan with the patient. The patient was provided an opportunity to ask questions and all were answered. The patient agreed with the plan and demonstrated an understanding of the instructions.   The patient was advised to call back or seek an in-person evaluation if the symptoms worsen or if the condition fails to improve as anticipated.     I provided 15 minutes total of non-face-to-face time during this encounter including median intraservice time, reviewing previous notes, investigations, ordering medications, medical decision making, coordinating care and patient verbalized understanding at the end of the visit.    Marcy Siren, D.O. Primary Care at [facility]  05/07/2020, 11:01 AM

## 2020-05-08 ENCOUNTER — Ambulatory Visit: Payer: Self-pay | Admitting: Internal Medicine

## 2020-05-26 ENCOUNTER — Ambulatory Visit (INDEPENDENT_AMBULATORY_CARE_PROVIDER_SITE_OTHER): Payer: No Typology Code available for payment source | Admitting: Family Medicine

## 2020-06-09 ENCOUNTER — Ambulatory Visit (INDEPENDENT_AMBULATORY_CARE_PROVIDER_SITE_OTHER): Payer: No Typology Code available for payment source | Admitting: Family Medicine

## 2020-06-11 ENCOUNTER — Telehealth (INDEPENDENT_AMBULATORY_CARE_PROVIDER_SITE_OTHER): Payer: Self-pay | Admitting: Licensed Clinical Social Worker

## 2020-06-11 NOTE — Telephone Encounter (Signed)
Call placed to patient regarding IBH referral. LCSW left message requesting a return call.  

## 2020-06-16 ENCOUNTER — Telehealth: Payer: Self-pay | Admitting: Licensed Clinical Social Worker

## 2020-06-16 NOTE — Telephone Encounter (Signed)
Call placed to patient.  LCSW introduced self and explained role at community care clinics.  Patient was informed of referral by PCP.  Patient shared that she is interested in initiating behavioral health services in the community. LCSW discussed strategies to assist patient in establishing services.  No additional concerns noted.

## 2020-06-22 ENCOUNTER — Ambulatory Visit (INDEPENDENT_AMBULATORY_CARE_PROVIDER_SITE_OTHER): Payer: No Typology Code available for payment source | Admitting: Family Medicine

## 2020-06-23 ENCOUNTER — Encounter: Payer: Self-pay | Admitting: Internal Medicine

## 2020-08-07 NOTE — Patient Instructions (Signed)
Thank you for choosing Primary Care at [facility] to be your medical home!    [name] Ray was seen by De Hollingshead, DO today.   Cristal Generous primary care provider is Marcy Siren, DO.   For the best care possible, you should try to see Marcy Siren, DO whenever you come to the clinic.   We look forward to seeing you again soon!  If you have any questions about your visit today, please call us at [phone] or feel free to reach your primary care provider via MyChart.   Keeping You Healthy  Get These Tests  Blood Pressure- Have your blood pressure checked by your healthcare provider at least once a year.  Normal blood pressure is 120/80.  Weight- Have your body mass index (BMI) calculated to screen for obesity.  BMI is a measure of body fat based on height and weight.  You can calculate your own BMI at https://www.west-esparza.com/  Cholesterol- Have your cholesterol checked every year.  Diabetes- Have your blood sugar checked every year if you have high blood pressure, high cholesterol, a family history of diabetes or if you are overweight.  Pap Test - Have a pap test every 1 to 5 years if you have been sexually active.  If you are older than 65 and recent pap tests have been normal you may not need additional pap tests.  In addition, if you have had a hysterectomy  for benign disease additional pap tests are not necessary.  Mammogram-Yearly mammograms are essential for early detection of breast cancer  Screening for Colon Cancer- Colonoscopy starting at age 16. Screening may begin sooner depending on your family history and other health conditions.  Follow up colonoscopy as directed by your Gastroenterologist.  Screening for Osteoporosis- Screening begins at age 48 with bone density scanning, sooner if you are at higher risk for developing Osteoporosis.  Get these medicines  Calcium with Vitamin D- Your body requires 1200-1500 mg of Calcium a day and  [phone] IU of Vitamin D a day.  You can only absorb 500 mg of Calcium at a time therefore Calcium must be taken in 2 or 3 separate doses throughout the day.  Hormones- Hormone therapy has been associated with increased risk for certain cancers and heart disease.  Talk to your healthcare provider about if you need relief from menopausal symptoms.  Aspirin- Ask your healthcare provider about taking Aspirin to prevent Heart Disease and Stroke.  Get these Immuniztions  Flu shot- Every fall  Pneumonia shot- Once after the age of 44; if you are younger ask your healthcare provider if you need a pneumonia shot.  Tetanus- Every ten years.  Zostavax- Once after the age of 56 to prevent shingles.  Take these steps  Don't smoke- Your healthcare provider can help you quit. For tips on how to quit, ask your healthcare provider or go to www.smokefree.gov or call 1-800 QUIT-NOW.  Be physically active- Exercise 5 days a week for a minimum of 30 minutes.  If you are not already physically active, start slow and gradually work up to 30 minutes of moderate physical activity.  Try walking, dancing, bike riding, swimming, etc.  Eat a healthy diet- Eat a variety of healthy foods such as fruits, vegetables, whole grains, low fat milk, low fat cheeses, yogurt, lean meats, chicken, fish, eggs, dried beans, tofu, etc.  For more information go to www.thenutritionsource.org  Dental visit- Brush and floss teeth twice daily; visit your dentist twice a  year.  Eye exam- Visit your Optometrist or Ophthalmologist yearly.  Drink alcohol in moderation- Limit alcohol intake to one drink or less a day.  Never drink and drive.  Depression- Your emotional health is as important as your physical health.  If you're feeling down or losing interest in things you normally enjoy, please talk to your healthcare provider.  Seat Belts- can save your life; always wear one  Smoke/Carbon Monoxide detectors- These detectors need to  be installed on the appropriate level of your home.  Replace batteries at least once a year.  Violence- If anyone is threatening or hurting you, please tell your healthcare provider.  Living Will/ Health care power of attorney- Discuss with your healthcare provider and family.

## 2020-08-10 ENCOUNTER — Other Ambulatory Visit (HOSPITAL_COMMUNITY)
Admission: RE | Admit: 2020-08-10 | Discharge: 2020-08-10 | Disposition: A | Discharge status: Home / Self Care | Payer: Self-pay | Source: Ambulatory Visit | Attending: Internal Medicine | Admitting: Internal Medicine

## 2020-08-10 ENCOUNTER — Ambulatory Visit (INDEPENDENT_AMBULATORY_CARE_PROVIDER_SITE_OTHER): Payer: Self-pay | Admitting: Internal Medicine

## 2020-08-10 ENCOUNTER — Other Ambulatory Visit: Payer: Self-pay

## 2020-08-10 ENCOUNTER — Encounter: Payer: Self-pay | Admitting: Internal Medicine

## 2020-08-10 ENCOUNTER — Ambulatory Visit (INDEPENDENT_AMBULATORY_CARE_PROVIDER_SITE_OTHER): Payer: Self-pay

## 2020-08-10 VITALS — BP 133/83 | HR 90 | Temp 97.7°F | Resp 17 | Ht 66.0 in | Wt 346.0 lb

## 2020-08-10 DIAGNOSIS — Z23 Encounter for immunization: Secondary | ICD-10-CM

## 2020-08-10 DIAGNOSIS — G8929 Other chronic pain: Secondary | ICD-10-CM

## 2020-08-10 DIAGNOSIS — E559 Vitamin D deficiency, unspecified: Secondary | ICD-10-CM

## 2020-08-10 DIAGNOSIS — Z0000 Encounter for general adult medical examination without abnormal findings: Secondary | ICD-10-CM

## 2020-08-10 DIAGNOSIS — Z13228 Encounter for screening for other metabolic disorders: Secondary | ICD-10-CM

## 2020-08-10 DIAGNOSIS — Z13220 Encounter for screening for lipoid disorders: Secondary | ICD-10-CM

## 2020-08-10 DIAGNOSIS — Z87898 Personal history of other specified conditions: Secondary | ICD-10-CM

## 2020-08-10 DIAGNOSIS — Z124 Encounter for screening for malignant neoplasm of cervix: Secondary | ICD-10-CM

## 2020-08-10 DIAGNOSIS — M25552 Pain in left hip: Secondary | ICD-10-CM

## 2020-08-10 DIAGNOSIS — Z1231 Encounter for screening mammogram for malignant neoplasm of breast: Secondary | ICD-10-CM

## 2020-08-10 DIAGNOSIS — Z113 Encounter for screening for infections with a predominantly sexual mode of transmission: Secondary | ICD-10-CM

## 2020-08-10 DIAGNOSIS — Z1159 Encounter for screening for other viral diseases: Secondary | ICD-10-CM

## 2020-08-10 DIAGNOSIS — Z130 Encounter for screening for diseases of the blood and blood-forming organs and certain disorders involving the immune mechanism: Secondary | ICD-10-CM

## 2020-08-10 NOTE — Progress Notes (Signed)
Subjective:    [name] Ray - 64 y.o. female MRN [id number]  Date of birth: 1956-08-15  HPI  [name] Ray is here for annual exam. Reports had normal mammogram at [facility] about 1-2 months ago.   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 Realizes she needs to lose weight to improve her symptoms. Doesn't like to take medications but will sometimes take Tylenol Arthritis or Naproxen. Has had corticosteroid injections but reports once they wore off, her knees felt worse. Has left hip pain that she wonders if might be arthritis as well. Requests x-ray as has not had imaging done of hip.      Health Maintenance:  Health Maintenance Due  Topic Date Due  . Hepatitis C Screening  Never done  . COVID-19 Vaccine (1) Never done  . MAMMOGRAM  05/15/2009  . INFLUENZA VACCINE  05/24/2020    -  reports that she has quit smoking. Her smoking use included cigarettes. She has a 10.00 pack-year smoking history. She has never used smokeless tobacco. - Review of Systems: Per HPI. - Past Medical History: Patient Active Problem List   Diagnosis Date Noted  . Prediabetes 05/07/2020  . Refractive error 04/05/2017  . Elevated blood pressure reading in office with diagnosis of hypertension 04/05/2017  . Morbid obesity (HCC) 01/28/2016  . Tobacco abuse 01/28/2016  . History of gastric bypass 01/28/2016   - Medications: reviewed and updated   Objective:   Physical Exam BP 133/83   Pulse 90   Temp 97.7 F (36.5 C) (Temporal)   Resp 17   Ht 5\' 6"  (1.676 m)   Wt (!) 346 lb (156.9 kg)   SpO2 95%   BMI 55.85 kg/m  Physical Exam Constitutional:      Appearance: She is not diaphoretic.  HENT:     Head: Normocephalic and atraumatic.  Eyes:     Conjunctiva/sclera: Conjunctivae normal.     Pupils: Pupils are equal, round, and reactive to light.  Neck:     Thyroid: No thyromegaly.  Cardiovascular:     Rate and Rhythm: Normal rate and regular rhythm.     Heart sounds: Normal heart sounds. No murmur  heard.   Pulmonary:     Effort: Pulmonary effort is normal. No respiratory distress.     Breath sounds: Normal breath sounds. No wheezing.  Abdominal:     General: Bowel sounds are normal. There is no distension.     Palpations: Abdomen is soft.     Tenderness: There is no abdominal tenderness. There is no guarding or rebound.  Genitourinary:    Comments: GU/GYN: Exam performed in the presence of a chaperone. External genitalia within normal limits.  Vaginal mucosa pink, moist, normal rugae.  Nonfriable cervix without lesions, no discharge or bleeding noted on speculum exam.  Bimanual exam revealed normal, nongravid uterus.  No cervical motion tenderness. No adnexal masses bilaterally.   Musculoskeletal:        General: No deformity. Normal range of motion.     Cervical back: Normal range of motion and neck supple.  Lymphadenopathy:     Cervical: No cervical adenopathy.  Skin:    General: Skin is warm and dry.     Findings: No rash.  Neurological:     Mental Status: She is alert and oriented to person, place, and time.     Gait: Gait is intact.  Psychiatric:        Mood and Affect: Mood and affect normal.  Judgment: Judgment normal.            Assessment & Plan:   1. Annual physical exam Counseled on 150 minutes of exercise per week, healthy eating (including decreased daily intake of saturated fats, cholesterol, added sugars, sodium), STI prevention, routine healthcare maintenance. - CBC with Differential - Comprehensive metabolic panel - Lipid Panel  2. Screening for deficiency anemia - CBC with Differential  3. Screening for metabolic disorder - Comprehensive metabolic panel  4. Lipid screening - Lipid Panel  5. Pap smear for cervical cancer screening - Cytology - PAP([facility])  6. Need for hepatitis C screening test - HCV Ab w/Rflx to Verification  7. Needs flu shot - Flu Vaccine QUAD 6+ mos PF IM (Fluarix Quad PF)  8. Vitamin D  deficiency Taking Vit D 1000 IU daily. Check levels.  - Vitamin D, 25-hydroxy  9. History of prediabetes - Hemoglobin A1c  10. Chronic left hip pain Obtain x-ray to evaluate for OA. Discussed weight loss will ultimately help her joint pain. Discussed other supportive care measures.  - DG Hip Unilat W OR W/O Pelvis 2-3 Views Left; Future    Marcy Siren, D.O. 08/10/2020, 10:21 AM Primary Care at [facility]

## 2020-08-11 ENCOUNTER — Other Ambulatory Visit: Payer: Self-pay | Admitting: Internal Medicine

## 2020-08-11 DIAGNOSIS — M1612 Unilateral primary osteoarthritis, left hip: Secondary | ICD-10-CM

## 2020-08-11 LAB — LIPID PANEL
Chol/HDL Ratio: 3.2 ratio (ref 0.0–4.4)
Cholesterol, Total: 182 mg/dL (ref 100–199)
HDL: 57 mg/dL (ref >39)
LDL Chol Calc (NIH): 109 mg/dL — ABNORMAL HIGH (ref 0–99)
Triglycerides: 85 mg/dL (ref 0–149)
VLDL Cholesterol Cal: 16 mg/dL (ref 5–40)

## 2020-08-11 LAB — COMPREHENSIVE METABOLIC PANEL
ALT: 13 IU/L (ref 0–32)
AST: 13 IU/L (ref 0–40)
Albumin/Globulin Ratio: 1.5 (ref 1.2–2.2)
Albumin: 4.2 g/dL (ref 3.8–4.8)
Alkaline Phosphatase: 107 IU/L (ref 44–121)
BUN/Creatinine Ratio: 17 (ref 12–28)
BUN: 12 mg/dL (ref 8–27)
Bilirubin Total: 0.2 mg/dL (ref 0.0–1.2)
CO2: 26 mmol/L (ref 20–29)
Calcium: 9.5 mg/dL (ref 8.7–10.3)
Chloride: 103 mmol/L (ref 96–106)
Creatinine, Ser: 0.7 mg/dL (ref 0.57–1.00)
GFR calc Af Amer: 106 mL/min/{1.73_m2} (ref >59)
GFR calc non Af Amer: 92 mL/min/{1.73_m2} (ref >59)
Globulin, Total: 2.8 g/dL (ref 1.5–4.5)
Glucose: 107 mg/dL — ABNORMAL HIGH (ref 65–99)
Potassium: 4.2 mmol/L (ref 3.5–5.2)
Sodium: 142 mmol/L (ref 134–144)
Total Protein: 7 g/dL (ref 6.0–8.5)

## 2020-08-11 LAB — VITAMIN D 25 HYDROXY (VIT D DEFICIENCY, FRACTURES): Vit D, 25-Hydroxy: 29 ng/mL — ABNORMAL LOW (ref 30.0–100.0)

## 2020-08-11 LAB — CBC WITH DIFFERENTIAL/PLATELET
Basophils Absolute: 0.1 10*3/uL (ref 0.0–0.2)
Basos: 1 %
EOS (ABSOLUTE): 0.4 10*3/uL (ref 0.0–0.4)
Eos: 4 %
Hematocrit: 37.1 % (ref 34.0–46.6)
Hemoglobin: 12.1 g/dL (ref 11.1–15.9)
Immature Grans (Abs): 0 10*3/uL (ref 0.0–0.1)
Immature Granulocytes: 0 %
Lymphocytes Absolute: 2.8 10*3/uL (ref 0.7–3.1)
Lymphs: 30 %
MCH: 28 pg (ref 26.6–33.0)
MCHC: 32.6 g/dL (ref 31.5–35.7)
MCV: 86 fL (ref 79–97)
Monocytes Absolute: 0.6 10*3/uL (ref 0.1–0.9)
Monocytes: 7 %
Neutrophils Absolute: 5.6 10*3/uL (ref 1.4–7.0)
Neutrophils: 58 %
Platelets: 289 10*3/uL (ref 150–450)
RBC: 4.32 x10E6/uL (ref 3.77–5.28)
RDW: 14.1 % (ref 11.7–15.4)
WBC: 9.5 10*3/uL (ref 3.4–10.8)

## 2020-08-11 LAB — HEMOGLOBIN A1C
Est. average glucose Bld gHb Est-mCnc: 134 mg/dL
Hgb A1c MFr Bld: 6.3 % — ABNORMAL HIGH (ref 4.8–5.6)

## 2020-08-11 LAB — HCV INTERPRETATION

## 2020-08-11 LAB — HCV AB W/RFLX TO VERIFICATION: HCV Ab: <0.1 s/co ratio (ref 0.0–0.9)

## 2020-08-13 LAB — CYTOLOGY - PAP
Adequacy: ABSENT
Comment: NEGATIVE
Diagnosis: NEGATIVE
High risk HPV: NEGATIVE

## 2020-08-25 ENCOUNTER — Other Ambulatory Visit: Payer: Self-pay | Admitting: Internal Medicine

## 2020-08-25 MED ORDER — METFORMIN HCL 500 MG PO TABS: 500.0000 mg | ORAL_TABLET | Freq: Every day | ORAL | 3 refills | Status: DC

## 2020-08-25 MED ORDER — TRAMADOL HCL 50 MG PO TABS: 50.0000 mg | ORAL_TABLET | Freq: Three times a day (TID) | ORAL | 0 refills | Status: AC | PRN

## 2020-09-01 ENCOUNTER — Ambulatory Visit: Payer: Self-pay | Admitting: Orthopaedic Surgery

## 2020-09-10 ENCOUNTER — Ambulatory Visit: Payer: Self-pay | Admitting: Orthopaedic Surgery

## 2020-09-15 ENCOUNTER — Ambulatory Visit: Payer: Self-pay | Admitting: Orthopaedic Surgery

## 2020-09-22 ENCOUNTER — Ambulatory Visit: Payer: Self-pay | Admitting: Orthopaedic Surgery

## 2020-09-29 ENCOUNTER — Ambulatory Visit: Payer: Self-pay | Admitting: Orthopaedic Surgery

## 2020-11-03 ENCOUNTER — Ambulatory Visit (INDEPENDENT_AMBULATORY_CARE_PROVIDER_SITE_OTHER): Payer: Medicare Other | Admitting: Orthopaedic Surgery

## 2020-11-03 ENCOUNTER — Encounter: Payer: Self-pay | Admitting: Orthopaedic Surgery

## 2020-11-03 ENCOUNTER — Ambulatory Visit: Payer: Self-pay

## 2020-11-03 DIAGNOSIS — M7062 Trochanteric bursitis, left hip: Secondary | ICD-10-CM | POA: Diagnosis not present

## 2020-11-03 MED ORDER — TRAMADOL HCL 50 MG PO TABS: 50.0000 mg | ORAL_TABLET | Freq: Two times a day (BID) | ORAL | 0 refills | Status: DC | PRN

## 2020-11-03 NOTE — Progress Notes (Signed)
Subjective: She is here for ultrasound-guided left greater trochanter injection.  Injection by palpation is difficult due to patient body habitus.  Objective: She is point tender over the left greater trochanter.  Procedure: Ultrasound guided injection: After sterile prep with Betadine, localized to the greater trochanter with ultrasound.  Then by palpation over the same area, injected 3 cc 0.25% bupivacaine and 6 mg betamethasone.  She had excellent relief during the immediate anesthetic phase.  Follow-up as directed.  Encouraged her to do lateral leg raises for strengthening in order to prevent recurrence.

## 2020-11-03 NOTE — Progress Notes (Signed)
Office Visit Note   Patient: [name] Ray           Date of Birth: 1956/02/23           MRN: [id number] Visit Date: 11/03/2020              Requested by: Arvilla Market, DO [address] [place],  [place] [address] PCP: Arvilla Market, DO   Assessment & Plan: Visit Diagnoses:  1. Trochanteric bursitis, left hip     Plan: Impression is left hip trochanteric bursitis.  I have referred the patient to Dr. Prince Rome for ultrasound-guided cortisone injection.  I provided her with a iliotibial band exercise program.  She will follow-up with [name] as needed.  Follow-Up Instructions: Return if symptoms worsen or fail to improve.   Orders:  No orders of the defined types were placed in this encounter.  Meds ordered this encounter  Medications  . traMADol (ULTRAM) 50 MG tablet    Sig: Take 1 tablet (50 mg total) by mouth every 12 (twelve) hours as needed.    Dispense:  30 tablet    Refill:  0      Procedures: No procedures performed   Clinical Data: No additional findings.   Subjective: Chief Complaint  Patient presents with  . Left Hip - Pain    HPI patient is a pleasant 65 year old female who comes in today with left lateral hip pain.  This has been intermittent for the past year and has become more constant.  The pain is all to the lateral aspect.  She denies any pain to the groin or anterior thigh.  Her pain is worse going from a seated to standing position as well as with walking.  She has no pain at rest.  She has been taking Tylenol, Aleve and tramadol with mild relief of symptoms.    Review of Systems as detailed in HPI.  All others reviewed and are negative.   Objective: Vital Signs: There were no vitals taken for this visit.  Physical Exam well-developed and well-nourished female in no acute distress.  Alert and oriented x3.  Ortho Exam left hip exam shows a negative logroll negative FADIR.  She has moderate tenderness to the greater  trochanter.  Negative straight leg raise.  She is neurovascular intact distally.  Specialty Comments:  No specialty comments available.  Imaging: X-rays of the left hip reviewed by me in canopy show moderate degenerative changes.   PMFS History: Patient Active Problem List   Diagnosis Date Noted  . Prediabetes 05/07/2020  . Refractive error 04/05/2017  . Elevated blood pressure reading in office with diagnosis of hypertension 04/05/2017  . Morbid obesity (HCC) 01/28/2016  . Tobacco abuse 01/28/2016  . History of gastric bypass 01/28/2016   Past Medical History:  Diagnosis Date  . Arthritis     Family History  Problem Relation Age of Onset  . Lupus Sister     Past Surgical History:  Procedure Laterality Date  . APPENDECTOMY    . CHOLECYSTECTOMY    . GASTRIC BYPASS     Social History   Occupational History  . Not on file  Tobacco Use  . Smoking status: Former Smoker    Packs/day: 0.50    Years: 20.00    Pack years: 10.00    Types: Cigarettes  . Smokeless tobacco: Never Used  Substance and Sexual Activity  . Alcohol use: Yes    Alcohol/week: 1.0 standard drink    Types: 1  Glasses of wine per week  . Drug use: Not on file  . Sexual activity: Never    Birth control/protection: Abstinence

## 2021-01-14 ENCOUNTER — Other Ambulatory Visit: Payer: Self-pay | Admitting: Physician Assistant

## 2021-01-14 ENCOUNTER — Ambulatory Visit (INDEPENDENT_AMBULATORY_CARE_PROVIDER_SITE_OTHER): Payer: Medicare Other | Admitting: Orthopaedic Surgery

## 2021-01-14 ENCOUNTER — Ambulatory Visit (INDEPENDENT_AMBULATORY_CARE_PROVIDER_SITE_OTHER): Payer: Medicare Other

## 2021-01-14 ENCOUNTER — Encounter: Payer: Self-pay | Admitting: Orthopaedic Surgery

## 2021-01-14 VITALS — Ht 66.0 in | Wt 346.0 lb

## 2021-01-14 DIAGNOSIS — G5702 Lesion of sciatic nerve, left lower limb: Secondary | ICD-10-CM | POA: Diagnosis not present

## 2021-01-14 DIAGNOSIS — Z6841 Body Mass Index (BMI) 40.0 and over, adult: Secondary | ICD-10-CM

## 2021-01-14 MED ORDER — LIDOCAINE 5 % EX PTCH: 1.0000 | MEDICATED_PATCH | CUTANEOUS | 0 refills | Status: DC

## 2021-01-14 MED ORDER — TRAMADOL HCL 50 MG PO TABS: 50.0000 mg | ORAL_TABLET | Freq: Three times a day (TID) | ORAL | 3 refills | Status: DC | PRN

## 2021-01-14 NOTE — Progress Notes (Signed)
Office Visit Note   Patient: [name] Ray           Date of Birth: 1956-08-06           MRN: [id number] Visit Date: 01/14/2021              Requested by: Arvilla Market, DO [address] [place],  [place] [address] PCP: Arvilla Market, DO   Assessment & Plan: Visit Diagnoses:  1. Piriformis syndrome of left side   2. Body mass index 50.0-59.9, adult (HCC)   3. Morbid obesity (HCC)     Plan: Impression is left-sided piriformis syndrome.  At this point, I recommended continued NSAIDs and the initiation of physical therapy.  She would like to proceed with both.  She has also asked for refill of tramadol which I have refilled.  She will follow up with [name] Ray as needed. The patient meets the AMA guidelines for Morbid (severe) obesity with a BMI > 40.0 and I have recommended weight loss.  Follow-Up Instructions: Return if symptoms worsen or fail to improve.   Orders:  Orders Placed This Encounter  Procedures  . XR Lumbar Spine 2-3 Views   Meds ordered this encounter  Medications  . traMADol (ULTRAM) 50 MG tablet    Sig: Take 1 tablet (50 mg total) by mouth 3 (three) times daily as needed.    Dispense:  60 tablet    Refill:  3  . lidocaine (LIDODERM) 5 %    Sig: Place 1 patch onto the skin daily. Remove & Discard patch within 12 hours or as directed by MD    Dispense:  30 patch    Refill:  0      Procedures: No procedures performed   Clinical Data: No additional findings.   Subjective: Chief Complaint  Patient presents with  . Left Hip - Follow-up    HPI patient is a very pleasant 65 year old female who comes in today with left buttock pain.  She was seen by [name] Ray in January for left lateral hip pain for which she was referred to Dr. Prince Rome for trochanteric bursa injection.  She continues to have great relief of symptoms there.  She is still complaining of pain to the left buttocks, however that has been going on for a very long time.  Her symptoms  come and go and are worse with rain and with walking.  She uses lidocaine patches and takes tramadol which does seem to help.  She denies any numbness, tingling or burning or any pain down the left leg.  Review of Systems as detailed in HPI.  All others reviewed and are negative.   Objective: Vital Signs: Ht 5\' 6"  (1.676 m)   Wt (!) 346 lb (156.9 kg)   BMI 55.85 kg/m   Physical Exam well-developed well-nourished female no acute distress.  Alert oriented x3.  Ortho Exam left lower extremity exam shows a negative logroll negative FADIR.  Negative straight leg raise.  No tenderness to the trochanteric bursa.  She does have tenderness to the piriformis.  No focal weakness.  She is neurovascular intact distally.  Specialty Comments:  No specialty comments available.  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 Prediabetes 05/07/2020  . Refractive error 04/05/2017  . Elevated blood pressure reading in office with diagnosis  of hypertension 04/05/2017  . Morbid obesity (HCC) 01/28/2016  . Tobacco abuse 01/28/2016  . History of gastric bypass 01/28/2016   Past Medical History:  Diagnosis Date  . Arthritis     Family History  Problem Relation Age of Onset  . Lupus Sister     Past Surgical History:  Procedure Laterality Date  . APPENDECTOMY    . CHOLECYSTECTOMY    . GASTRIC BYPASS     Social History   Occupational History  . Not on file  Tobacco Use  . Smoking status: Former Smoker    Packs/day: 0.50    Years: 20.00    Pack years: 10.00    Types: Cigarettes  . Smokeless tobacco: Never Used  Substance and Sexual Activity  . Alcohol use: Yes    Alcohol/week: 1.0 standard drink    Types: 1 Glasses of wine per week  . Drug use: Not on file  . Sexual activity: Never    Birth control/protection: Abstinence

## 2021-02-11 ENCOUNTER — Ambulatory Visit: Payer: Self-pay | Admitting: Internal Medicine

## 2021-02-17 ENCOUNTER — Ambulatory Visit: Payer: Self-pay | Admitting: Internal Medicine

## 2021-03-17 ENCOUNTER — Ambulatory Visit: Payer: Self-pay | Admitting: Internal Medicine

## 2021-04-14 ENCOUNTER — Encounter: Payer: Self-pay | Admitting: Internal Medicine

## 2021-05-19 ENCOUNTER — Other Ambulatory Visit: Payer: Self-pay | Admitting: Physician Assistant

## 2021-05-25 ENCOUNTER — Telehealth: Payer: Self-pay | Admitting: Orthopaedic Surgery

## 2021-05-25 ENCOUNTER — Other Ambulatory Visit: Payer: Self-pay | Admitting: Physician Assistant

## 2021-05-25 MED ORDER — TRAMADOL HCL 50 MG PO TABS: 50.0000 mg | ORAL_TABLET | Freq: Three times a day (TID) | ORAL | 0 refills | Status: DC | PRN

## 2021-05-25 NOTE — Telephone Encounter (Signed)
Pt called asking that we review her tramadol rx, pt states the way it was written. Pt would like a CB to discuss this further.   [phone]

## 2021-05-26 NOTE — Telephone Encounter (Signed)
Spoke to her on phone.  Refilled once more and will need to come back in to be seen if continuing to need this

## 2021-06-22 ENCOUNTER — Encounter: Payer: Self-pay | Admitting: Internal Medicine

## 2021-08-25 ENCOUNTER — Ambulatory Visit: Payer: Medicare (Managed Care) | Admitting: Orthopaedic Surgery

## 2021-09-22 ENCOUNTER — Ambulatory Visit: Payer: Self-pay | Admitting: Orthopaedic Surgery

## 2021-09-28 ENCOUNTER — Ambulatory Visit: Payer: Medicare (Managed Care) | Admitting: Family Medicine

## 2021-09-29 ENCOUNTER — Other Ambulatory Visit: Payer: Self-pay

## 2021-09-29 ENCOUNTER — Ambulatory Visit (INDEPENDENT_AMBULATORY_CARE_PROVIDER_SITE_OTHER): Payer: Medicare (Managed Care) | Admitting: Orthopaedic Surgery

## 2021-09-29 ENCOUNTER — Ambulatory Visit: Payer: Self-pay

## 2021-09-29 DIAGNOSIS — M25552 Pain in left hip: Secondary | ICD-10-CM

## 2021-09-29 MED ORDER — TRAMADOL HCL 50 MG PO TABS: 50.0000 mg | ORAL_TABLET | Freq: Every day | ORAL | 0 refills | Status: DC | PRN

## 2021-09-29 MED ORDER — PREDNISONE 10 MG (21) PO TBPK: ORAL_TABLET | ORAL | 3 refills | Status: DC

## 2021-09-29 NOTE — Addendum Note (Signed)
Addended by: Albertina Parr on: 09/29/2021 11:18 AM   Modules accepted: Orders

## 2021-09-29 NOTE — Progress Notes (Signed)
Office Visit Note   Patient: [name] Ray           Date of Birth: 1956-02-17           MRN: [id number] Visit Date: 09/29/2021              Requested by: Arvilla Market, MD [address] [place],  [place] [address] PCP: Arvilla Market, MD   Assessment & Plan: Visit Diagnoses:  1. Pain in left hip     Plan: Impression is lumbar radiculopathy.  She understands that the weight is an issue.  I we will refill the tramadol and prescribed prednisone.  We have made a referral to pain clinic for her request.  Follow-up as needed.  Follow-Up Instructions: No follow-ups on file.   Orders:  Orders Placed This Encounter  Procedures   XR HIP UNILAT W OR W/O PELVIS 2-3 VIEWS LEFT   Meds ordered this encounter  Medications   traMADol (ULTRAM) 50 MG tablet    Sig: Take 1-2 tablets (50-100 mg total) by mouth daily as needed.    Dispense:  20 tablet    Refill:  0   predniSONE (STERAPRED UNI-PAK 21 TAB) 10 MG (21) TBPK tablet    Sig: Take as directed    Dispense:  21 tablet    Refill:  3      Procedures: No procedures performed   Clinical Data: No additional findings.   Subjective: Chief Complaint  Patient presents with   Left Hip - Pain    HPI  [name] comes in today for left hip and low back pain.  She is experiencing burning pain radiating down her leg down into the shin.  Currently using lidocaine patches and NSAIDs.  Tramadol does help but she has run out.  Review of Systems  Constitutional: Negative.   HENT: Negative.    Eyes: Negative.   Respiratory: Negative.    Cardiovascular: Negative.   Endocrine: Negative.   Musculoskeletal: Negative.   Neurological: Negative.   Hematological: Negative.   Psychiatric/Behavioral: Negative.    All other systems reviewed and are negative.   Objective: Vital Signs: There were no vitals taken for this visit.  Physical Exam Vitals and nursing note reviewed.  Constitutional:      Appearance:  She is well-developed.  Pulmonary:     Effort: Pulmonary effort is normal.  Skin:    General: Skin is warm.     Capillary Refill: Capillary refill takes less than 2 seconds.  Neurological:     Mental Status: She is alert and oriented to person, place, and time.  Psychiatric:        Behavior: Behavior normal.        Thought Content: Thought content normal.        Judgment: Judgment normal.    Ortho Exam  Lumbar spine is nontender.  No sciatic tension signs.  No focal motor or sensory deficits.  Specialty Comments:  No specialty comments available.  Imaging: No results found.   PMFS History: Patient Active Problem List   Diagnosis Date Noted   Prediabetes 05/07/2020   Refractive error 04/05/2017   Elevated blood pressure reading in office with diagnosis of hypertension 04/05/2017   Morbid obesity (HCC) 01/28/2016   Tobacco abuse 01/28/2016   History of gastric bypass 01/28/2016   Past Medical History:  Diagnosis Date   Arthritis     Family History  Problem Relation Age of Onset   Lupus Sister  Past Surgical History:  Procedure Laterality Date   APPENDECTOMY     CHOLECYSTECTOMY     GASTRIC BYPASS     Social History   Occupational History   Not on file  Tobacco Use   Smoking status: Former    Packs/day: 0.50    Years: 20.00    Pack years: 10.00    Types: Cigarettes   Smokeless tobacco: Never  Substance and Sexual Activity   Alcohol use: Yes    Alcohol/week: 1.0 standard drink    Types: 1 Glasses of wine per week   Drug use: Not on file   Sexual activity: Never    Birth control/protection: Abstinence

## 2021-10-11 ENCOUNTER — Encounter: Payer: Self-pay | Admitting: Physical Medicine & Rehabilitation

## 2021-12-14 ENCOUNTER — Ambulatory Visit: Payer: Medicare (Managed Care) | Admitting: Physical Medicine & Rehabilitation

## 2021-12-16 ENCOUNTER — Encounter: Payer: Self-pay | Admitting: Physical Medicine & Rehabilitation

## 2021-12-16 ENCOUNTER — Encounter
Payer: Medicare (Managed Care) | Attending: Physical Medicine & Rehabilitation | Admitting: Physical Medicine & Rehabilitation

## 2021-12-16 ENCOUNTER — Other Ambulatory Visit: Payer: Self-pay

## 2021-12-16 VITALS — BP 156/93 | HR 78 | Ht 66.0 in | Wt 330.0 lb

## 2021-12-16 DIAGNOSIS — G5702 Lesion of sciatic nerve, left lower limb: Secondary | ICD-10-CM | POA: Diagnosis present

## 2021-12-16 DIAGNOSIS — M47816 Spondylosis without myelopathy or radiculopathy, lumbar region: Secondary | ICD-10-CM | POA: Diagnosis present

## 2021-12-16 DIAGNOSIS — M7062 Trochanteric bursitis, left hip: Secondary | ICD-10-CM | POA: Diagnosis present

## 2021-12-16 NOTE — Progress Notes (Signed)
Subjective:    Patient ID: [name] Ray, female    DOB: 1956-07-11, 66 y.o.   MRN: [id number]  HPI 66 year old female with intermittent soreness left lateral hip to left thigh into the left calf.  Average pain is 8 out of 10 when this comes on.  Worsening with walking and standing improves with rest pacing activities as well as medication.  Patient is taking a combination of ibuprofen and acetaminophen she has no stomach upset.  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  Her walking tolerance is about 15 to 20 minutes she is able to climb steps she is able to drive she is independent with all her self-care and mobility and does her housework. 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  In addition she has had good relief with prior left hip trochanteric bursa injection performed by Dr. Prince Rome under ultrasound guidance.  She also has an area that is tender over her left buttocks.  Dr. Roda Shutters diagnosed her with piriformis syndrome.  The patient has not undergone physical therapy. Pain Inventory Average Pain 8 Pain Right Now 2 My pain is intermittent and soreness  In the last 24 hours, has pain interfered with the following? General activity 0 Relation with others 0 Enjoyment of life 0 What TIME of day is your pain at its worst? morning  Sleep (in general) Fair  Pain is worse with: walking and standing Pain improves with: rest, pacing activities, and medication Relief from Meds: 5  how many minutes can you walk? 15-20 ability to climb steps?  yes do you drive?  yes  employed # of hrs/week 24  trouble walking  Any changes since last visit?  no  Any changes since last visit?  no    Family History  Problem Relation Age of Onset   Lupus Sister    Social History   Socioeconomic History   Marital status: Divorced    Spouse name: Not on file   Number of  children: Not on file   Years of education: Not on file   Highest education level: Not on file  Occupational History   Not on file  Tobacco Use   Smoking status: Former    Packs/day: 0.50    Years: 20.00    Pack years: 10.00    Types: Cigarettes   Smokeless tobacco: Never  Substance and Sexual Activity   Alcohol use: Yes    Alcohol/week: 1.0 standard drink    Types: 1 Glasses of wine per week   Drug use: Not on file   Sexual activity: Never    Birth control/protection: Abstinence  Other Topics Concern   Not on file  Social History Narrative   Not on file   Social Determinants of Health   Financial Resource Strain: Not on file  Food Insecurity: Not on file  Transportation Needs: Not on file  Physical Activity: Not on file  Stress: Not on file  Social Connections: Not on file   Past Surgical History:  Procedure Laterality Date   APPENDECTOMY     CHOLECYSTECTOMY     GASTRIC BYPASS     Past Medical History:  Diagnosis Date   Arthritis    BP (!) 156/93    Pulse 78    Ht 5\' 6"  (1.676 m)    Wt (!) 330 lb (149.7 kg)  SpO2 95%    BMI 53.26 kg/m   Opioid Risk Score:   Fall Risk Score:  `1  Depression screen PHQ 2/9  Depression screen [facility] 2/9 12/16/2021 05/07/2020 04/04/2017 01/28/2016  Decreased Interest 1 0 0 0  Down, Depressed, Hopeless 0 0 0 0  PHQ - 2 Score 1 0 0 0  Altered sleeping 1 - - -  Tired, decreased energy 1 - - -  Change in appetite 1 - - -  Feeling bad or failure about yourself  0 - - -  Trouble concentrating 0 - - -  Moving slowly or fidgety/restless 0 - - -  Suicidal thoughts 0 - - -  PHQ-9 Score 4 - - -  Difficult doing work/chores Somewhat difficult - - -     Review of Systems  Musculoskeletal:        Left hip pain down the side of leg to knee Calf pain sometimes  All other systems reviewed and are negative.     Objective:   Physical Exam Vitals and nursing note reviewed.  Constitutional:      Appearance: She is obese.  HENT:      Head: Normocephalic and atraumatic.  Eyes:     Extraocular Movements: Extraocular movements intact.     Conjunctiva/sclera: Conjunctivae normal.     Pupils: Pupils are equal, round, and reactive to light.  Musculoskeletal:        General: No swelling or tenderness.     Cervical back: Normal range of motion.     Right lower leg: No edema.     Left lower leg: No edema.     Comments: Patient has tenderness over the left greater trochanter.  There is also tenderness over the left buttocks region about halfway in between PSIS and greater trochanter. Negative straight leg raising Left hip has decreased internal extra rotation but there is no groin pain with range of motion Negative thigh thrust test bilaterally.  Negative distraction test  Skin:    General: Skin is warm and dry.     Findings: No lesion.  Neurological:     Mental Status: She is alert and oriented to person, place, and time.     Motor: No weakness.     Gait: Gait normal.     Comments: Motor strength is 5/5 in bilateral hip flexor knee extensor ankle dorsiflexor Negative straight leg raising bilaterally Gait is without evidence of toe drag or knee instability.          Assessment & Plan:  1.  Chronic left hip pain appears to be mostly lateral and posterior in the buttocks.  No significant groin pain.  As discussed do not think the osteoarthritis is the major factor other than perhaps limiting her range of motion and causing some gait deviations. We will send to physical therapy for piriformis syndrome, hopes to increase hip range of motion as well as normalize gait Return to clinic for troches bursa injection under ultrasound guidance which is needed because of her body habitus. Would hold off on intra-articular hip injection.  If she does not respond to treatment may have to look more closely in the lumbar spine, has not had any axial imaging

## 2021-12-16 NOTE — Patient Instructions (Signed)
Back Exercises °These exercises help to make your trunk and back strong. They also help to keep the lower back flexible. Doing these exercises can help to prevent or lessen pain in your lower back. °If you have back pain, try to do these exercises 2-3 times each day or as told by your doctor. °As you get better, do the exercises once each day. Repeat the exercises more often as told by your doctor. °To stop back pain from coming back, do the exercises once each day, or as told by your doctor. °Do exercises exactly as told by your doctor. Stop right away if you feel sudden pain or your pain gets worse. °Exercises °Single knee to chest °Do these steps 3-5 times in a row for each leg: °Lie on your back on a firm bed or the floor with your legs stretched out. °Bring one knee to your chest. °Grab your knee or thigh with both hands and hold it in place. °Pull on your knee until you feel a gentle stretch in your lower back or butt. °Keep doing the stretch for 10-30 seconds. °Slowly let go of your leg and straighten it. °Pelvic tilt °Do these steps 5-10 times in a row: °Lie on your back on a firm bed or the floor with your legs stretched out. °Bend your knees so they point up to the ceiling. Your feet should be flat on the floor. °Tighten your lower belly (abdomen) muscles to press your lower back against the floor. This will make your tailbone point up to the ceiling instead of pointing down to your feet or the floor. °Stay in this position for 5-10 seconds while you gently tighten your muscles and breathe evenly. °Cat-cow °Do these steps until your lower back bends more easily: °Get on your hands and knees on a firm bed or the floor. Keep your hands under your shoulders, and keep your knees under your hips. You may put padding under your knees. °Let your head hang down toward your chest. Tighten (contract) the muscles in your belly. Point your tailbone toward the floor so your lower back becomes rounded like the back of a  cat. °Stay in this position for 5 seconds. °Slowly lift your head. Let the muscles of your belly relax. Point your tailbone up toward the ceiling so your back forms a sagging arch like the back of a cow. °Stay in this position for 5 seconds. ° °Press-ups °Do these steps 5-10 times in a row: °Lie on your belly (face-down) on a firm bed or the floor. °Place your hands near your head, about shoulder-width apart. °While you keep your back relaxed and keep your hips on the floor, slowly straighten your arms to raise the top half of your body and lift your shoulders. Do not use your back muscles. You may change where you place your hands to make yourself more comfortable. °Stay in this position for 5 seconds. Keep your back relaxed. °Slowly return to lying flat on the floor. ° °Bridges °Do these steps 10 times in a row: °Lie on your back on a firm bed or the floor. °Bend your knees so they point up to the ceiling. Your feet should be flat on the floor. Your arms should be flat at your sides, next to your body. °Tighten your butt muscles and lift your butt off the floor until your waist is almost as high as your knees. If you do not feel the muscles working in your butt and the back of   your thighs, slide your feet 1-2 inches (2.5-5 cm) farther away from your butt. °Stay in this position for 3-5 seconds. °Slowly lower your butt to the floor, and let your butt muscles relax. °If this exercise is too easy, try doing it with your arms crossed over your chest. °Belly crunches °Do these steps 5-10 times in a row: °Lie on your back on a firm bed or the floor with your legs stretched out. °Bend your knees so they point up to the ceiling. Your feet should be flat on the floor. °Cross your arms over your chest. °Tip your chin a little bit toward your chest, but do not bend your neck. °Tighten your belly muscles and slowly raise your chest just enough to lift your shoulder blades a tiny bit off the floor. Avoid raising your body  higher than that because it can put too much stress on your lower back. °Slowly lower your chest and your head to the floor. °Back lifts °Do these steps 5-10 times in a row: °Lie on your belly (face-down) with your arms at your sides, and rest your forehead on the floor. °Tighten the muscles in your legs and your butt. °Slowly lift your chest off the floor while you keep your hips on the floor. Keep the back of your head in line with the curve in your back. Look at the floor while you do this. °Stay in this position for 3-5 seconds. °Slowly lower your chest and your face to the floor. °Contact a doctor if: °Your back pain gets a lot worse when you do an exercise. °Your back pain does not get better within 2 hours after you exercise. °If you have any of these problems, stop doing the exercises. Do not do them again unless your doctor says it is okay. °Get help right away if: °You have sudden, very bad back pain. If this happens, stop doing the exercises. Do not do them again unless your doctor says it is okay. °This information is not intended to replace advice given to you by your health care provider. Make sure you discuss any questions you have with your health care provider. °Document Revised: 12/23/2020 Document Reviewed: 12/23/2020 °Elsevier Patient Education © 2022 [employer] ° °

## 2022-01-04 NOTE — Therapy (Addendum)
?OUTPATIENT PHYSICAL THERAPY EVALUATION /DISCHARGE ? ? ?Patient Name: [name] Ray ?MRN: [id number] ?DOB:1956/05/11, 66 y.o., female ?Today's Date: 01/05/2022 ? ? PT End of Session - 01/05/22 1107   ? ? Visit Number 1   ? Number of Visits 20   ? Date for PT Re-Evaluation 03/16/22   ? Authorization Type [employer] $25 copay   ? Progress Note Due on Visit 10   ? PT Start Time 1111   ? PT Stop Time 1143   ? PT Time Calculation (min) 32 min   ? Activity Tolerance Patient tolerated treatment well   ? Behavior During Therapy [facility] for tasks assessed/performed   ? ?  ?  ? ?  ? ? ?Past Medical History:  ?Diagnosis Date  ? Arthritis   ? ?Past Surgical History:  ?Procedure Laterality Date  ? APPENDECTOMY    ? CHOLECYSTECTOMY    ? GASTRIC BYPASS    ? ?Patient Active Problem List  ? Diagnosis Date Noted  ? Prediabetes 05/07/2020  ? Refractive error 04/05/2017  ? Elevated blood pressure reading in office with diagnosis of hypertension 04/05/2017  ? Morbid obesity (HCC) 01/28/2016  ? Tobacco abuse 01/28/2016  ? History of gastric bypass 01/28/2016  ? ? ?PCP: Arvilla Market, MD ? ?REFERRING PROVIDER: Erick Colace, MD ? ?REFERRING DIAG: G57.02 (ICD-10-CM) - Piriformis syndrome of left side ?M70.62 (ICD-10-CM) - Trochanteric bursitis of left hip ?M47.816 (ICD-10-CM) - Spondylosis without myelopathy or radiculopathy, lumbar region ? ?THERAPY DIAG:  ?Chronic bilateral low back pain, unspecified whether sciatica present ? ?Pain in left hip ? ?Muscle weakness (generalized) ? ?Difficulty in walking, not elsewhere classified ? ?Abnormal posture ? ?ONSET DATE: Approx. 6 months ago ? ?SUBJECTIVE:                                                                                                                                                                                          ? ?SUBJECTIVE STATEMENT: ?Pt indicated complaints of soreness in back of hip area.  Symptoms give her "hesitation to move at time."  Insidious  onset.   Pt. Indicated she does try to do some exercise on sofa at home.  Pt denied trouble sleeping due to pain.  Pt indicated occasional complaints outside of Lt leg but noticed that ankle movements has helped that.  ?PERTINENT HISTORY:  ?Pt reported arthritis in hip and knees, obesity  ? ?PAIN:  ?Are you having pain? Yes: NPRS scale: pain at worst 8/10 ?Pain location: Back, Lt hip/leg ?Pain description: Pressure pain ?Aggravating factors: stepping/walking at times, stair ?Relieving factors: OTC medicine , patches ? ? ?PRECAUTIONS: None ? ?WEIGHT  BEARING RESTRICTIONS No ? ?FALLS:  ?Has patient fallen in last 6 months? No, Number of falls: 0 ? ?LIVING ENVIRONMENT: ?Stairs: bedroom on 2nd floor, rail on Rt side  ? 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 ?PLOF: Independent, hobbies of planting/gardening ? ?PATIENT GOALS Reduce pain, get cardiovascular health improved (has treadmill) ? ? ?OBJECTIVE:  ? ?PATIENT SURVEYS:  ?01/05/2022: FOTO intake: 45  predicted:  55 ? ?SCREENING FOR RED FLAGS: ?Bowel or bladder incontinence: No ? ?COGNITION: ? 01/05/2022:Overall cognitive status: Within functional limits for tasks assessed   ?  ?SENSATION: ?01/05/2022:Light touch: WFL ? ?MUSCLE/LEG LENGTH: ?01/05/2022: ? ?Leg length:  Rt leg approx. 0.75 inch shorter than Lt ? ?POSTURE:  ?01/05/2022: Rt iliac crest lower in stance compared to Lt ? ?PALPATION: ?01/05/2022: Tenderness Lt superior glute max, piriformis ? ?LUMBAR ROM:  ? ?Active  AROM  ?01/05/2022  ?Flexion Movement to ankles c pain noted at end range  ?Extension 50% with no change in symptoms  ?Right lateral flexion To Rt knee joint without pain noted  ?Left lateral flexion To lateral epicondyle c pain noted   ?Right rotation   ?Left rotation   ? (Blank rows = not tested) ? ?LE ROM: ? ?PROM  Right ?01/05/2022 ? ?Measured in supine Left ?01/05/2022 ?Measured in supine  ?Hip flexion 110 95  ?Hip extension    ?Hip abduction    ?Hip adduction    ?Hip internal rotation 20 in 90 deg flexion 25 in  90 deg flexion  ?Hip external rotation 50 in 90 deg flexion 40 in 90 deg flexion  ?Knee flexion    ?Knee extension    ?Ankle dorsiflexion    ?Ankle plantarflexion    ?Ankle inversion    ?Ankle eversion    ? (Blank rows = not tested) ? ?LE MMT: ? ?MMT Right ?01/05/2022 Left ?01/05/2022  ?Hip flexion 5/5 5/5  ?Hip extension    ?Hip abduction 3+/5   ?Hip adduction    ?Hip internal rotation    ?Hip external rotation    ?Knee flexion 5/5 5/5  ?Knee extension 5/5 5/5  ?Ankle dorsiflexion 5/5 5/5  ?Ankle plantarflexion    ?Ankle inversion    ?Ankle eversion    ? (Blank rows = not tested) ? ?LUMBAR SPECIAL TESTS:  ?01/05/2022:(-) slump bilateral, (-) crossed SLR bilateral ? ?FUNCTIONAL TESTS:  ?01/05/2022:  18 inch chair transfer s UE assist: c pain in standing up ? ?GAIT: ?01/05/2022: independent ambulation Lt hip drop in stance ? ? ? ?TODAY'S TREATMENT  ?01/05/2022: ?Therex: HEP instruction/performance c cues for techniques, handout provided.  Trial set performed of each for comprehension and symptom assessment.  See below for exercise list. ? ?Provided heel lift for Rt shoe.  ? ? ?PATIENT EDUCATION:  ?01/05/2022: ?Education details: HEP, POC ?Person educated: Patient ?Education method: Explanation, Demonstration, Verbal cues, and Handouts ?Education comprehension: verbalized understanding and returned demonstration ? ? ?HOME EXERCISE PROGRAM: ?01/05/2022: ?Access Code: [email or web address] ?URL: [email or web address] ?Date: 01/05/2022 ?Prepared by: Chyrel MassonMichael Ellyn Rubiano ? ?Exercises ?Supine Lower Trunk Rotation - 2-3 x daily - 7 x weekly - 1 sets - 3-5 reps - 15 hold ?Clamshell - 2 x daily - 7 x weekly - 2-3 sets - 10 reps ?Supine Bridge - 2 x daily - 7 x weekly - 2-3 sets - 10 reps - 2 hold ?Supine Figure 4 Piriformis Stretch (Mirrored) - 2-3 x daily - 7 x weekly - 1 sets - 3 reps - 30 hold ?Supine Piriformis Stretch with Foot on Ground (Mirrored) - 2-3  x daily - 7 x weekly - 1 sets - 3 reps - 30 hold ? ? ?ASSESSMENT: ? ?CLINICAL  IMPRESSION: ?Patient is a 66 y.o. who comes to clinic with complaints of low back/Lt hip pain with mobility, strength and movement coordination deficits that impair their ability to perform usual daily and recreational functional activities without increase difficulty/symptoms at this time.  Patient to benefit from skilled PT services to address impairments and limitations to improve to previous level of function without restriction secondary to condition.  ? ? ?OBJECTIVE IMPAIRMENTS Abnormal gait, decreased activity tolerance, decreased balance, decreased coordination, decreased endurance, decreased mobility, difficulty walking, decreased ROM, decreased strength, hypomobility, impaired perceived functional ability, impaired flexibility, improper body mechanics, postural dysfunction, and pain.  ? ?ACTIVITY LIMITATIONS cleaning, community activity, occupation, laundry, and shopping.  ? ? ?REHAB POTENTIAL: Good ? ?CLINICAL DECISION MAKING: Stable/uncomplicated ? ?EVALUATION COMPLEXITY: Low ? ? ?GOALS: ?Goals reviewed with patient? Yes ? ?Short term PT Goals (target date for Short term goals are 3 weeks 01/26/2022) ?Patient will demonstrate independent use of home exercise program to maintain progress from in clinic treatments. ?Goal status: New ?  ?Long term PT goals (target dates for all long term goals are 10 weeks  03/16/2022 ) ?Patient will demonstrate/report pain at worst less than or equal to 2/10 to facilitate minimal limitation in daily activity secondary to pain symptoms. ?Goal status: New ? ?Patient will demonstrate independent use of home exercise program to facilitate ability to maintain/progress functional gains from skilled physical therapy services. ?Goal status: New ? ?Patient will demonstrate FOTO outcome > or = 55 % to indicate reduced disability due to condition. ?Goal status: New ? ?Patient will demonstrate lumbar extension 100 % WFL s symptoms to facilitate upright standing, walking posture at PLOF s  limitation. ?Goal status: New ? ?    5.  Patient will demonstrate BLE MMT > = 5/5 throughout to facilitate ability to perform usual standing, walking, stairs at PLOF s limitation due to symptoms. ? [place]

## 2022-01-05 ENCOUNTER — Encounter: Payer: Self-pay | Admitting: Rehabilitative and Restorative Service Providers"

## 2022-01-05 ENCOUNTER — Ambulatory Visit (INDEPENDENT_AMBULATORY_CARE_PROVIDER_SITE_OTHER): Payer: Medicare (Managed Care) | Admitting: Rehabilitative and Restorative Service Providers"

## 2022-01-05 ENCOUNTER — Other Ambulatory Visit: Payer: Self-pay

## 2022-01-05 DIAGNOSIS — M545 Low back pain, unspecified: Secondary | ICD-10-CM

## 2022-01-05 DIAGNOSIS — M6281 Muscle weakness (generalized): Secondary | ICD-10-CM | POA: Diagnosis not present

## 2022-01-05 DIAGNOSIS — M25552 Pain in left hip: Secondary | ICD-10-CM

## 2022-01-05 DIAGNOSIS — R262 Difficulty in walking, not elsewhere classified: Secondary | ICD-10-CM | POA: Diagnosis not present

## 2022-01-05 DIAGNOSIS — R293 Abnormal posture: Secondary | ICD-10-CM

## 2022-01-05 DIAGNOSIS — G8929 Other chronic pain: Secondary | ICD-10-CM

## 2022-01-18 ENCOUNTER — Telehealth: Payer: Self-pay | Admitting: Physical Therapy

## 2022-01-18 ENCOUNTER — Encounter: Payer: Medicare (Managed Care) | Admitting: Physical Therapy

## 2022-01-18 NOTE — Telephone Encounter (Signed)
Pt did not show for PT appointment today. They were contacted by phone without answer and voicemail was not setup so I was unable to leave a message. ? ?Ivery Quale, PT, DPT ?01/18/22 10:34 AM ? ?

## 2022-01-26 ENCOUNTER — Telehealth: Payer: Self-pay | Admitting: Physical Therapy

## 2022-01-26 ENCOUNTER — Encounter: Payer: Medicare (Managed Care) | Admitting: Physical Therapy

## 2022-01-26 NOTE — Telephone Encounter (Signed)
Pt did not show for PT appointment today. They were contacted and informed of this and she states she got her appointments mixed up and that she thought she had MD appointment for injection today. This appointment is for tomorrow and she states she does not feel she can do any PT until after she has her injection. I cancelled this appointment today and she does have another PT appointment scheduled for 02/09/22 and she states she wants to keep that one.  ? ?Elsie Ra, PT, DPT ?01/26/22 12:08 PM ? ?

## 2022-01-27 ENCOUNTER — Encounter
Payer: Medicare (Managed Care) | Attending: Physical Medicine & Rehabilitation | Admitting: Physical Medicine & Rehabilitation

## 2022-01-27 ENCOUNTER — Encounter: Payer: Self-pay | Admitting: Physical Medicine & Rehabilitation

## 2022-01-27 VITALS — BP 151/91 | HR 94 | Ht 66.0 in | Wt 327.0 lb

## 2022-01-27 DIAGNOSIS — M7062 Trochanteric bursitis, left hip: Secondary | ICD-10-CM | POA: Diagnosis present

## 2022-01-27 MED ORDER — TRAMADOL HCL 50 MG PO TABS: 50.0000 mg | ORAL_TABLET | Freq: Four times a day (QID) | ORAL | 0 refills | Status: DC | PRN

## 2022-01-27 NOTE — Progress Notes (Signed)
LEFT Trochanteric bursa injection ?With ultrasound guidance ? ?Indication ?Trochanteric bursitis. Exam has tenderness over the greater trochanter of the hip. Pain has not responded to conservative care such as exercise therapy and oral medications. Pain interferes with sleep or with mobility ?Informed consent was obtained after describing risks and benefits of the procedure with the patient these include bleeding bruising and infection. Patient has signed written consent form. Patient placed in a lateral decubitus position with the affected hip superior. Point of maximal pain was palpated marked and prepped with Betadine and entered with a 22g 5 in Quincke needle to bone contact. Needle slightly withdrawn then 6mg  of betamethasone with 4 cc 1% lidocaine were injected. Patient tolerated procedure well. Post procedure instructions given.  ?

## 2022-02-09 ENCOUNTER — Encounter: Payer: Medicare (Managed Care) | Admitting: Rehabilitative and Restorative Service Providers"

## 2022-03-11 ENCOUNTER — Encounter: Payer: Medicare (Managed Care) | Admitting: Physical Medicine & Rehabilitation

## 2022-06-06 IMAGING — DX DG HIP (WITH OR WITHOUT PELVIS) 2-3V*L*
3 series · 3 of 3 positions shown · non-contrast
Comparison: None.

CLINICAL DATA: Chronic left hip pain.

EXAM:
DG HIP (WITH OR WITHOUT PELVIS) 2-3V LEFT

[hip joint ap]
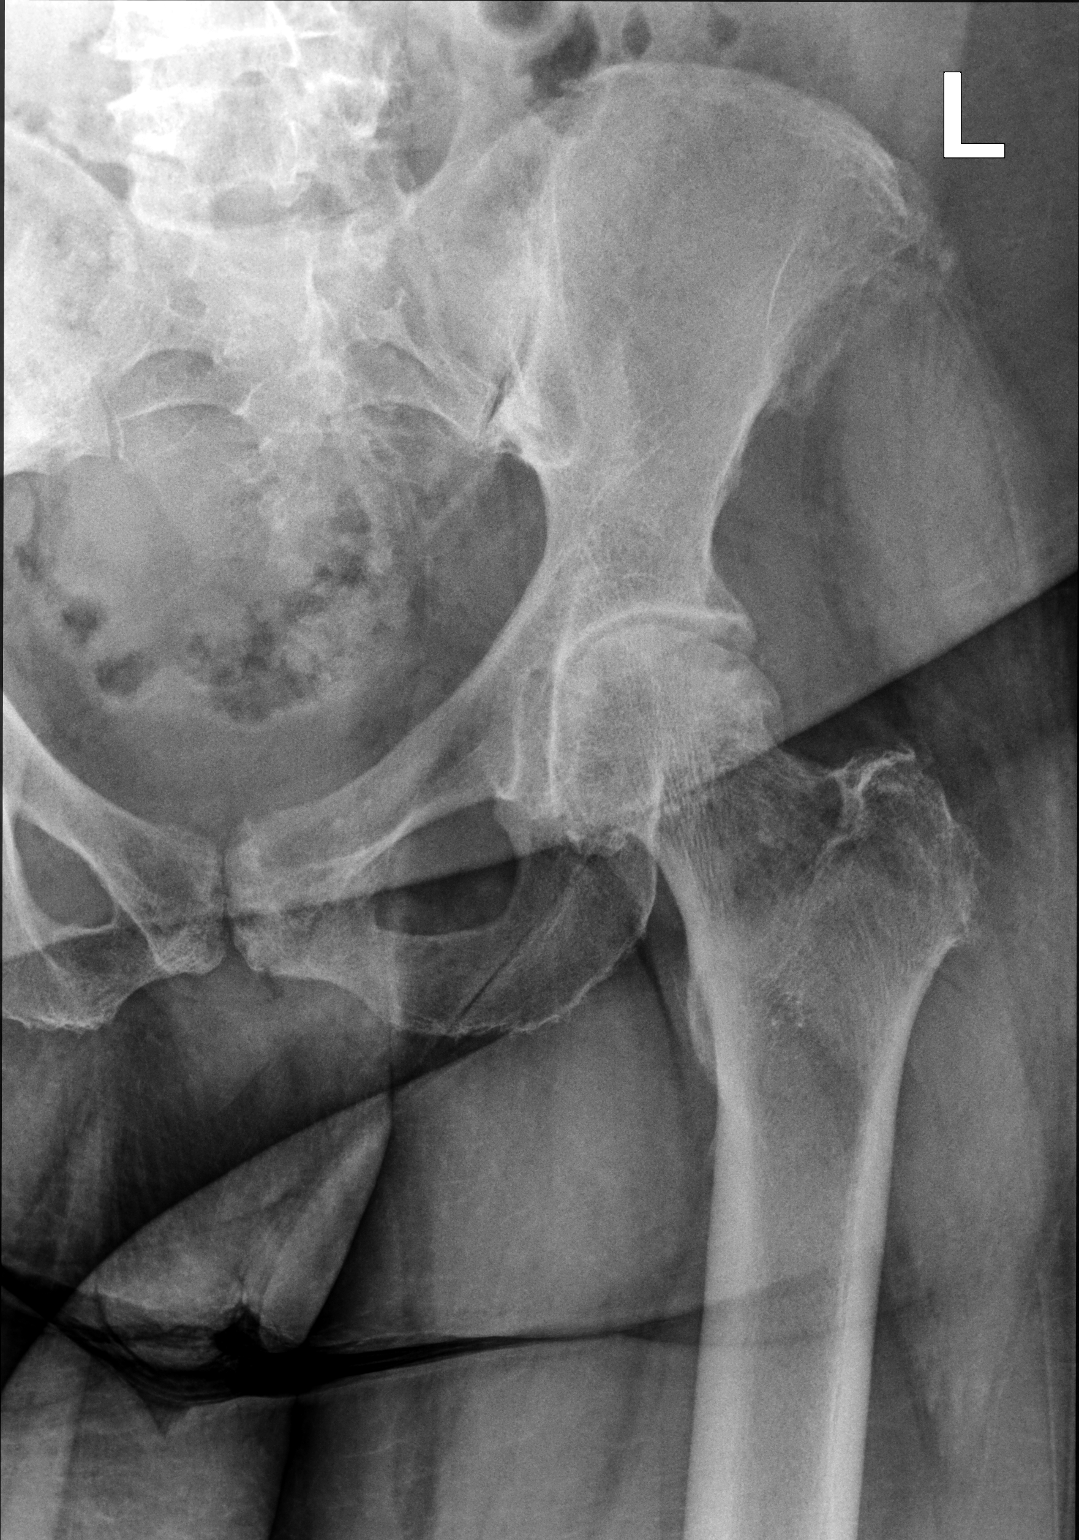

[hip joint [person_name] projection]
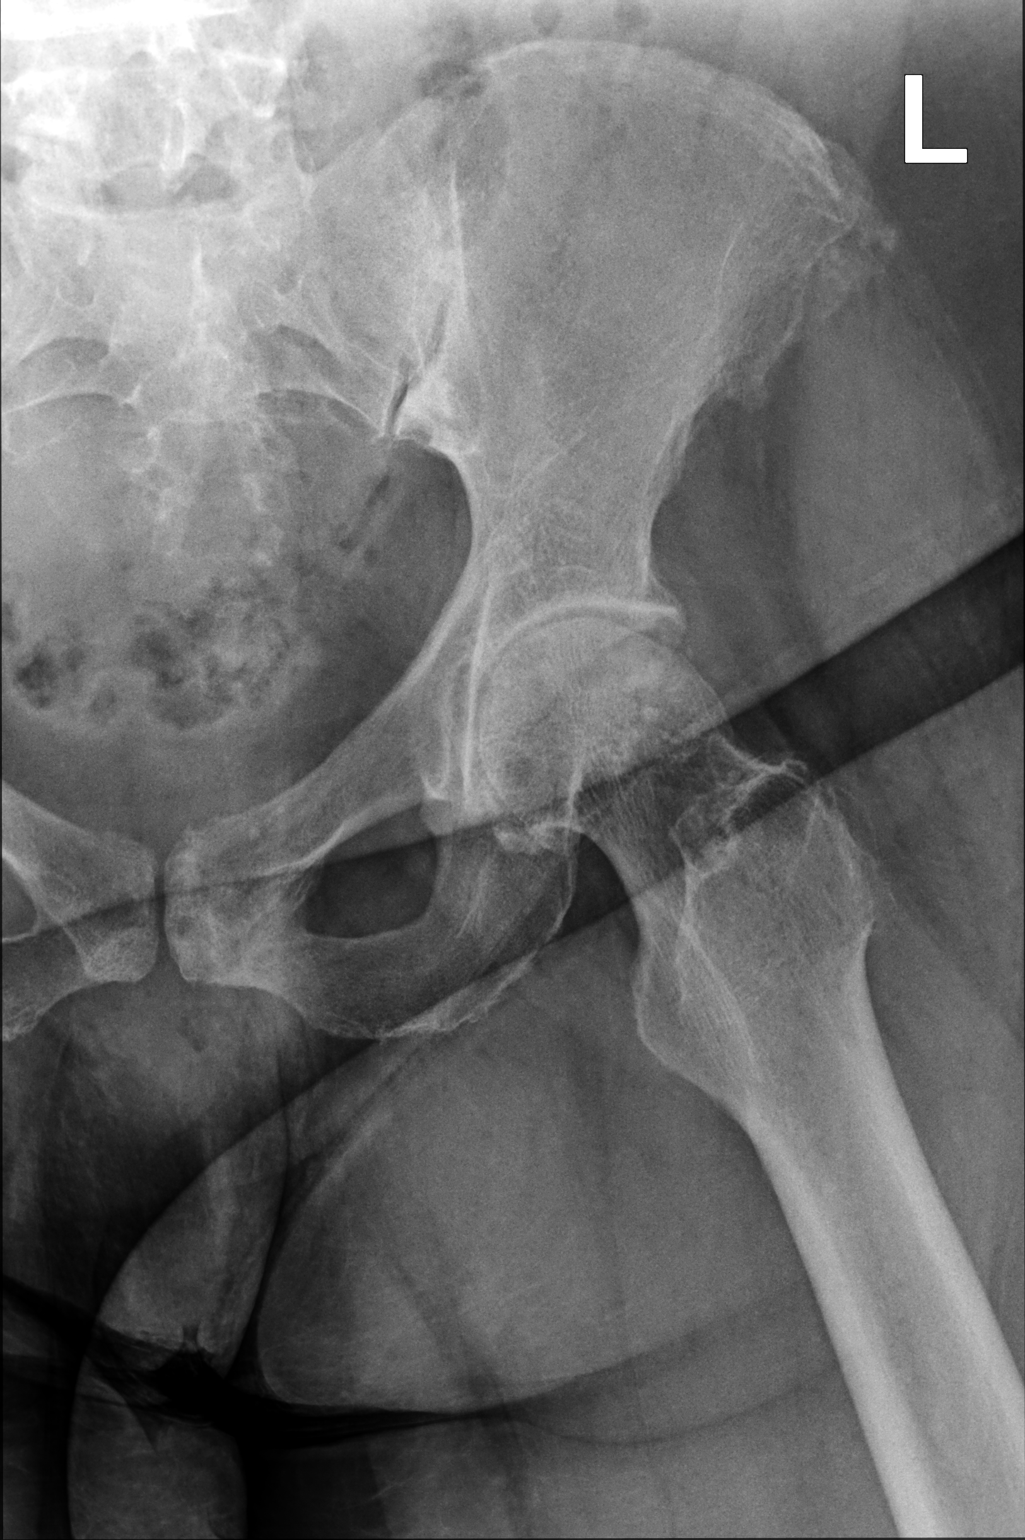

[pelvis ap]
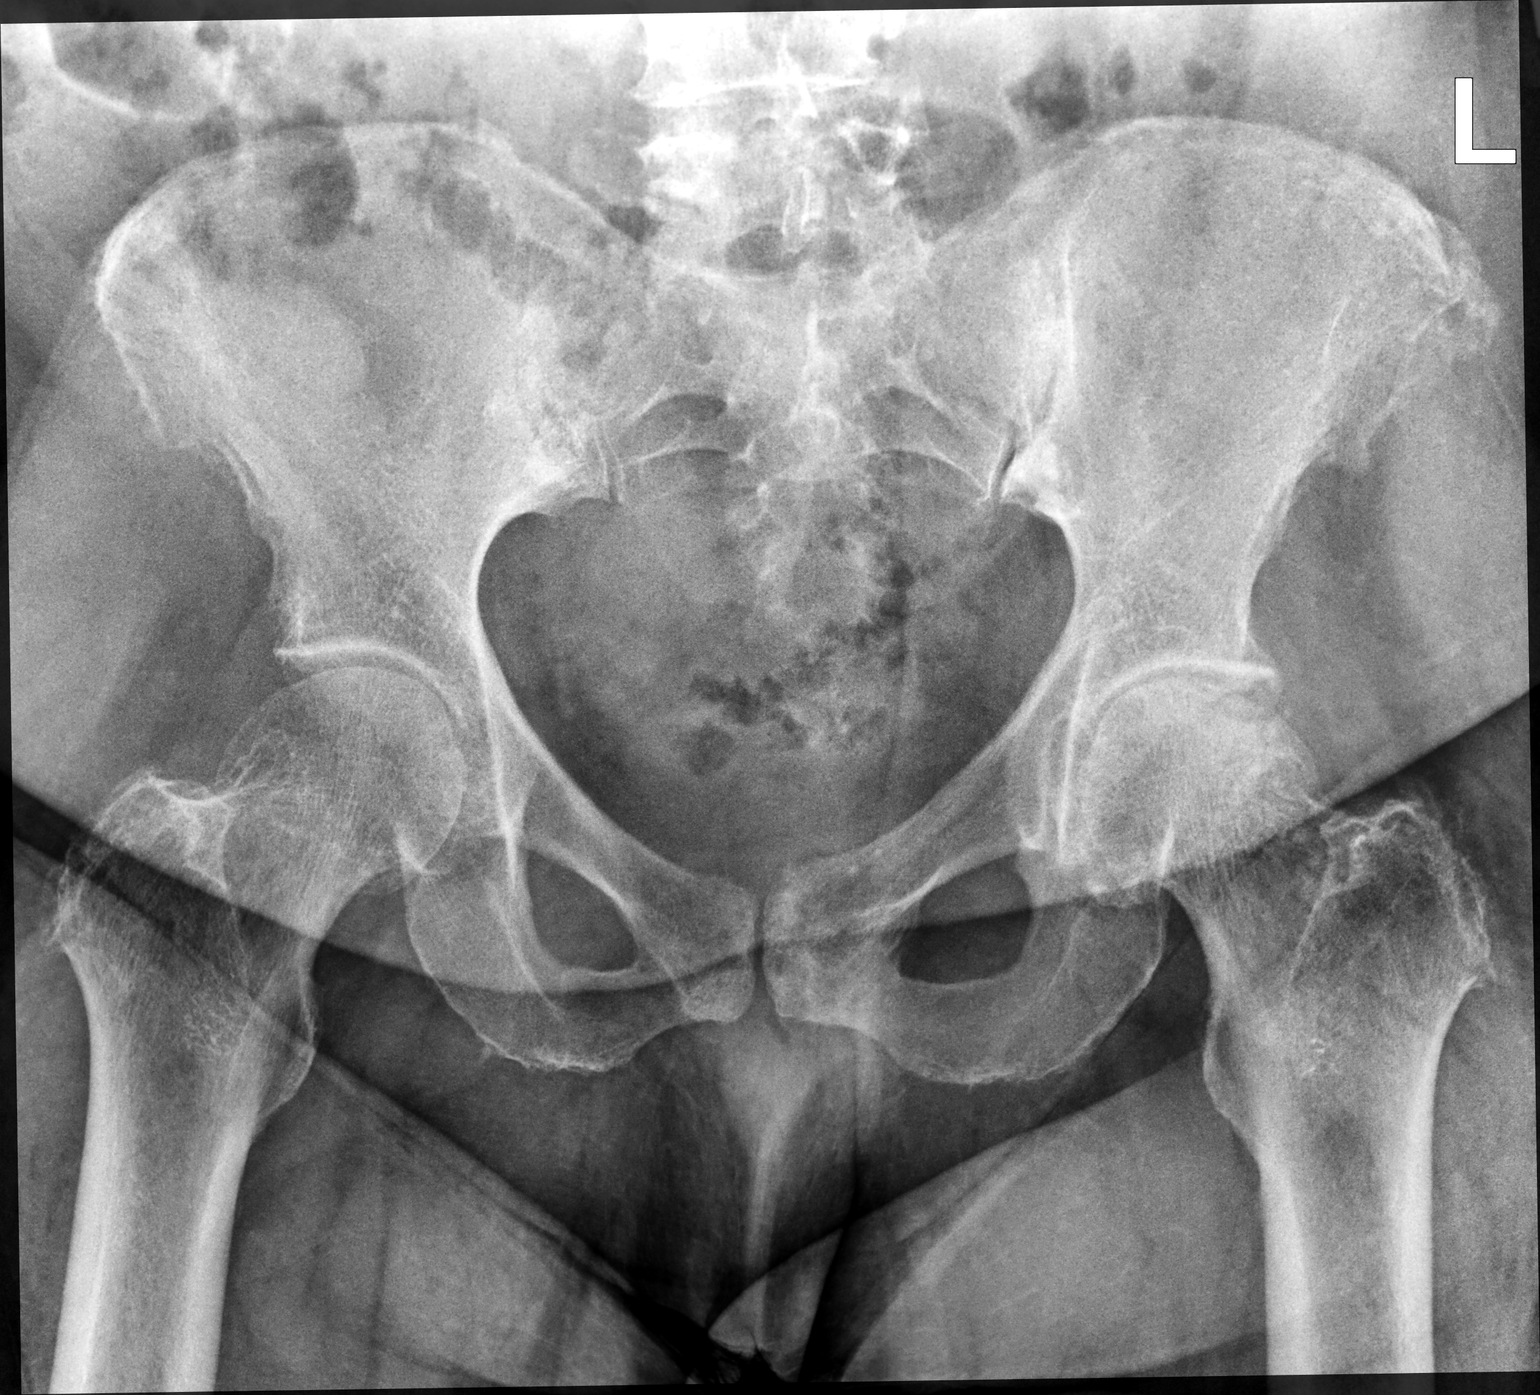

[3 of 3 positions shown; findings below may reference images not displayed]

FINDINGS: There is no evidence of hip fracture or dislocation. Severe
narrowing of the left hip joint is noted with osteophyte formation.
IMPRESSION: Severe osteoarthritis of the left hip. No acute abnormality seen.

## 2022-06-07 ENCOUNTER — Encounter: Payer: Self-pay | Admitting: Family Medicine

## 2022-06-07 ENCOUNTER — Ambulatory Visit (INDEPENDENT_AMBULATORY_CARE_PROVIDER_SITE_OTHER): Payer: Medicare (Managed Care) | Admitting: Family Medicine

## 2022-06-07 VITALS — BP 138/77 | HR 74 | Temp 98.1°F | Resp 16 | Ht 66.0 in | Wt 313.0 lb

## 2022-06-07 DIAGNOSIS — E669 Obesity, unspecified: Secondary | ICD-10-CM

## 2022-06-07 DIAGNOSIS — Z13220 Encounter for screening for lipoid disorders: Secondary | ICD-10-CM

## 2022-06-07 DIAGNOSIS — Z0001 Encounter for general adult medical examination with abnormal findings: Secondary | ICD-10-CM

## 2022-06-07 DIAGNOSIS — Z0000 Encounter for general adult medical examination without abnormal findings: Secondary | ICD-10-CM

## 2022-06-07 DIAGNOSIS — Z6841 Body Mass Index (BMI) 40.0 and over, adult: Secondary | ICD-10-CM | POA: Diagnosis not present

## 2022-06-07 DIAGNOSIS — Z130 Encounter for screening for diseases of the blood and blood-forming organs and certain disorders involving the immune mechanism: Secondary | ICD-10-CM

## 2022-06-07 NOTE — Progress Notes (Unsigned)
New Patient Office Visit  Subjective    Patient ID: [name] Ray, female    DOB: 12-27-1955  Age: 66 y.o. MRN: [id number]  CC:  Chief Complaint  Patient presents with   Annual Exam    HPI [name] Ray presents to establish care with  ***  Outpatient Encounter Medications as of 06/07/2022  Medication Sig   lidocaine (LIDODERM) 5 % Place 1 patch onto the skin daily. Remove & Discard patch within 12 hours or as directed by MD   metFORMIN (GLUCOPHAGE) 500 MG tablet Take 1 tablet (500 mg total) by mouth daily with breakfast.   traMADol (ULTRAM) 50 MG tablet Take 1 tablet (50 mg total) by mouth every 6 (six) hours as needed for moderate pain or severe pain.   vitamin B-12 (CYANOCOBALAMIN) 1000 MCG tablet Take 1,000 mcg by mouth daily.   cholecalciferol (VITAMIN D3) 25 MCG (1000 UNIT) tablet Take 1,000 Units by mouth daily. (Patient not taking: Reported on 06/07/2022)   Coenzyme Q10 (COQ10) 100 MG CAPS Take 1 capsule by mouth daily. (Patient not taking: Reported on 06/07/2022)   ferrous sulfate 325 (65 FE) MG tablet Take 325 mg by mouth daily with breakfast. (Patient not taking: Reported on 06/07/2022)   No facility-administered encounter medications on file as of 06/07/2022.    Past Medical History:  Diagnosis Date   Arthritis     Past Surgical History:  Procedure Laterality Date   APPENDECTOMY     CHOLECYSTECTOMY     GASTRIC BYPASS      Family History  Problem Relation Age of Onset   Lupus Sister     Social History   Socioeconomic History   Marital status: Divorced    Spouse name: Not on file   Number of children: Not on file   Years of education: Not on file   Highest education level: Not on file  Occupational History   Not on file  Tobacco Use   Smoking status: Former    Packs/day: 0.50    Years: 20.00    Total pack years: 10.00    Types: Cigarettes   Smokeless tobacco: Never  Substance and Sexual Activity   Alcohol use: Yes    Alcohol/week:  1.0 standard drink of alcohol    Types: 1 Glasses of wine per week   Drug use: Not on file   Sexual activity: Never    Birth control/protection: Abstinence  Other Topics Concern   Not on file  Social History Narrative   Not on file   Social Determinants of Health   Financial Resource Strain: Not on file  Food Insecurity: Not on file  Transportation Needs: Not on file  Physical Activity: Not on file  Stress: Not on file  Social Connections: Not on file  Intimate Partner Violence: Not on file    ROS      Objective    BP 138/77   Pulse 74   Temp 98.1 F (36.7 C) (Oral)   Resp 16   Ht 5\' 6"  (1.676 m)   Wt (!) 313 lb (142 kg)   SpO2 94%   BMI 50.52 kg/m   Physical Exam  {Labs (Optional):23779}    Assessment & Plan:   Problem List Items Addressed This Visit   None Visit Diagnoses     Annual physical exam    -  Primary   Screening for deficiency anemia       Screening for lipid disorders  Screening for endocrine/metabolic/immunity disorders           No follow-ups on file.   Tommie Raymond, MD

## 2022-06-07 NOTE — Progress Notes (Unsigned)
Patient is here for CPE. Patient would like to talk with provider about weight loss

## 2022-06-08 ENCOUNTER — Other Ambulatory Visit: Payer: Self-pay | Admitting: Family Medicine

## 2022-06-08 LAB — CMP14+EGFR
ALT: 30 IU/L (ref 0–32)
AST: 20 IU/L (ref 0–40)
Albumin/Globulin Ratio: 1.5 (ref 1.2–2.2)
Albumin: 4.3 g/dL (ref 3.9–4.9)
Alkaline Phosphatase: 104 IU/L (ref 44–121)
BUN/Creatinine Ratio: 17 (ref 12–28)
BUN: 15 mg/dL (ref 8–27)
Bilirubin Total: <0.2 mg/dL (ref 0.0–1.2)
CO2: 22 mmol/L (ref 20–29)
Calcium: 9.6 mg/dL (ref 8.7–10.3)
Chloride: 104 mmol/L (ref 96–106)
Creatinine, Ser: 0.88 mg/dL (ref 0.57–1.00)
Globulin, Total: 2.9 g/dL (ref 1.5–4.5)
Glucose: 93 mg/dL (ref 70–99)
Potassium: 4.8 mmol/L (ref 3.5–5.2)
Sodium: 142 mmol/L (ref 134–144)
Total Protein: 7.2 g/dL (ref 6.0–8.5)
eGFR: 72 mL/min/{1.73_m2} (ref >59)

## 2022-06-08 LAB — TSH: TSH: 2.3 u[IU]/mL (ref 0.450–4.500)

## 2022-06-08 LAB — LIPID PANEL
Chol/HDL Ratio: 3 ratio (ref 0.0–4.4)
Cholesterol, Total: 176 mg/dL (ref 100–199)
HDL: 58 mg/dL (ref >39)
LDL Chol Calc (NIH): 106 mg/dL — ABNORMAL HIGH (ref 0–99)
Triglycerides: 64 mg/dL (ref 0–149)
VLDL Cholesterol Cal: 12 mg/dL (ref 5–40)

## 2022-06-08 LAB — CBC WITH DIFFERENTIAL/PLATELET
Basophils Absolute: 0.1 10*3/uL (ref 0.0–0.2)
Basos: 1 %
EOS (ABSOLUTE): 0.4 10*3/uL (ref 0.0–0.4)
Eos: 4 %
Hematocrit: 37 % (ref 34.0–46.6)
Hemoglobin: 12.4 g/dL (ref 11.1–15.9)
Immature Grans (Abs): 0 10*3/uL (ref 0.0–0.1)
Immature Granulocytes: 0 %
Lymphocytes Absolute: 2.9 10*3/uL (ref 0.7–3.1)
Lymphs: 32 %
MCH: 28.1 pg (ref 26.6–33.0)
MCHC: 33.5 g/dL (ref 31.5–35.7)
MCV: 84 fL (ref 79–97)
Monocytes Absolute: 0.5 10*3/uL (ref 0.1–0.9)
Monocytes: 6 %
Neutrophils Absolute: 5.2 10*3/uL (ref 1.4–7.0)
Neutrophils: 57 %
Platelets: 299 10*3/uL (ref 150–450)
RBC: 4.41 x10E6/uL (ref 3.77–5.28)
RDW: 14 % (ref 11.7–15.4)
WBC: 9.1 10*3/uL (ref 3.4–10.8)

## 2022-06-08 LAB — HEMOGLOBIN A1C
Est. average glucose Bld gHb Est-mCnc: 123 mg/dL
Hgb A1c MFr Bld: 5.9 % — ABNORMAL HIGH (ref 4.8–5.6)

## 2022-06-08 LAB — VITAMIN D 25 HYDROXY (VIT D DEFICIENCY, FRACTURES): Vit D, 25-Hydroxy: 22.5 ng/mL — ABNORMAL LOW (ref 30.0–100.0)

## 2022-06-08 MED ORDER — VITAMIN D (ERGOCALCIFEROL) 1.25 MG (50000 UNIT) PO CAPS: 50000.0000 [IU] | ORAL_CAPSULE | ORAL | 0 refills | Status: DC

## 2022-06-10 ENCOUNTER — Encounter
Payer: Medicare (Managed Care) | Attending: Physical Medicine & Rehabilitation | Admitting: Physical Medicine & Rehabilitation

## 2022-06-10 ENCOUNTER — Encounter: Payer: Self-pay | Admitting: Physical Medicine & Rehabilitation

## 2022-06-10 VITALS — BP 122/77 | HR 75 | Ht 66.0 in | Wt 311.0 lb

## 2022-06-10 DIAGNOSIS — M1612 Unilateral primary osteoarthritis, left hip: Secondary | ICD-10-CM | POA: Diagnosis present

## 2022-06-10 DIAGNOSIS — M7062 Trochanteric bursitis, left hip: Secondary | ICD-10-CM | POA: Insufficient documentation

## 2022-06-10 MED ORDER — TRAMADOL HCL 50 MG PO TABS: 50.0000 mg | ORAL_TABLET | Freq: Four times a day (QID) | ORAL | 0 refills | Status: DC | PRN

## 2022-06-10 NOTE — Progress Notes (Signed)
Subjective:    Patient ID: [name] Ray, female    DOB: November 05, 1955, 66 y.o.   MRN: [id number]  HPI  Patient with chronic left hip pain.  She also has chronic back pain.  After physical therapy her back pain improved.  She has not received any physical therapy for her left hip pain.  She does have osteoarthritis.  Looking reviewed x-rays today she does have an intact joint space although it is moderately narrowed.  In addition she does have some osteophytes on the femoral head however these are not in the region of the acetabular articulation. Overall she is doing quite well she is only using some very occasional tramadol 30 tablets has lasted her essentially 4 months. Troch bursa injection   Pain Inventory Average Pain 6 Pain Right Now 4 My pain is intermittent and dull shooting  In the last 24 hours, has pain interfered with the following? General activity 2 Relation with others 2 Enjoyment of life 2 What TIME of day is your pain at its worst? morning  Sleep (in general) Fair  Pain is worse with: walking Pain improves with: rest, medication, and injections Relief from Meds:  fair  Family History  Problem Relation Age of Onset   Lupus Sister    Social History   Socioeconomic History   Marital status: Divorced    Spouse name: Not on file   Number of children: Not on file   Years of education: Not on file   Highest education level: Not on file  Occupational History   Not on file  Tobacco Use   Smoking status: Former    Packs/day: 0.50    Years: 20.00    Total pack years: 10.00    Types: Cigarettes   Smokeless tobacco: Never  Vaping Use   Vaping Use: Some days   Substances: Flavoring  Substance and Sexual Activity   Alcohol use: Yes    Alcohol/week: 1.0 standard drink of alcohol    Types: 1 Glasses of wine per week   Drug use: Not on file   Sexual activity: Never    Birth control/protection: Abstinence  Other Topics Concern   Not on file  Social  History Narrative   Not on file   Social Determinants of Health   Financial Resource Strain: Not on file  Food Insecurity: Not on file  Transportation Needs: Not on file  Physical Activity: Not on file  Stress: Not on file  Social Connections: Not on file   Past Surgical History:  Procedure Laterality Date   APPENDECTOMY     CHOLECYSTECTOMY     GASTRIC BYPASS     Past Surgical History:  Procedure Laterality Date   APPENDECTOMY     CHOLECYSTECTOMY     GASTRIC BYPASS     Past Medical History:  Diagnosis Date   Arthritis    Ht 5\' 6"  (1.676 m)   Wt (!) 311 lb (141.1 kg)   BMI 50.20 kg/m   Opioid Risk Score:   Fall Risk Score:  `1  Depression screen [facility] 2/9     06/07/2022   10:28 AM 01/27/2022   11:58 AM 12/16/2021   10:00 AM 05/07/2020   11:02 AM 04/04/2017    5:09 PM 01/28/2016    2:42 PM  Depression screen PHQ 2/9  Decreased Interest 1 0 1 0 0 0  Down, Depressed, Hopeless 0 0 0 0 0 0  PHQ - 2 Score 1 0 1 0 0 0  Altered  sleeping 0  1     Tired, decreased energy 0  1     Change in appetite 1  1     Feeling bad or failure about yourself  0  0     Trouble concentrating 0  0     Moving slowly or fidgety/restless 0  0     Suicidal thoughts 0  0     PHQ-9 Score 2  4     Difficult doing work/chores Not difficult at all  Somewhat difficult       Review of Systems  Musculoskeletal:        Left hip pain  All other systems reviewed and are negative.      Objective:   Physical Exam Vitals and nursing note reviewed.  Constitutional:      Appearance: She is obese.  HENT:     Head: Normocephalic and atraumatic.  Eyes:     Extraocular Movements: Extraocular movements intact.     Conjunctiva/sclera: Conjunctivae normal.     Pupils: Pupils are equal, round, and reactive to light.  Musculoskeletal:     Comments: No tenderness palpation lumbar paraspinal area no tenderness of the PSIS mild tenderness of the left trochanteric bursa area Hip range of motion is equal  bilaterally only mildly reduced. No pain with hip range of motion. No evidence of contracture at the hip flexors.  Neurological:     Mental Status: She is alert and oriented to person, place, and time.  Psychiatric:        Mood and Affect: Mood normal.        Behavior: Behavior normal.           Assessment & Plan:   1.  Chronic left hip pain appears to be mainly trochanteric bursitis she does have OA on x-rays however joint space is only moderately reduced and she has good range of motion at the hip.  No pain with standing. We will send to physical therapy for troches bursa hip strengthening exercises as well as range of motion. If no improvements will do repeat Trope bursa injection under ultrasound guidance. Refill tramadol 30 tablets

## 2022-06-15 ENCOUNTER — Ambulatory Visit (INDEPENDENT_AMBULATORY_CARE_PROVIDER_SITE_OTHER): Payer: Medicare (Managed Care) | Admitting: Family Medicine

## 2022-06-15 ENCOUNTER — Encounter: Payer: Self-pay | Admitting: Family Medicine

## 2022-06-15 VITALS — BP 118/74 | HR 73 | Temp 98.1°F | Resp 16 | Wt 313.0 lb

## 2022-06-15 DIAGNOSIS — Z6841 Body Mass Index (BMI) 40.0 and over, adult: Secondary | ICD-10-CM

## 2022-06-15 DIAGNOSIS — M1612 Unilateral primary osteoarthritis, left hip: Secondary | ICD-10-CM | POA: Diagnosis not present

## 2022-06-15 MED ORDER — TRIAMCINOLONE ACETONIDE 40 MG/ML IJ SUSP
40.0000 mg | Freq: Once | INTRAMUSCULAR | Status: AC
  Administered 2022-06-15: 40 mg via INTRAMUSCULAR

## 2022-06-15 NOTE — Progress Notes (Unsigned)
   New Patient Office Visit  Subjective    Patient ID: [name] Ray Circle, female    DOB: [birth year]  Age: 66 y.o. MRN: [id number]  CC:  Chief Complaint  Patient presents with   Hip Pain    HPI [name] presents to establish care ***  Outpatient Encounter Medications as of 06/15/2022  Medication Sig   cholecalciferol (VITAMIN D3) 25 MCG (1000 UNIT) tablet Take 1,000 Units by mouth daily.   Coenzyme Q10 (COQ10) 100 MG CAPS Take 1 capsule by mouth daily.   ferrous sulfate 325 (65 FE) MG tablet Take 325 mg by mouth daily with breakfast.   lidocaine (LIDODERM) 5 % Place 1 patch onto the skin daily. Remove & Discard patch within 12 hours or as directed by MD   metFORMIN (GLUCOPHAGE) 500 MG tablet Take 1 tablet (500 mg total) by mouth daily with breakfast.   traMADol (ULTRAM) 50 MG tablet Take 1 tablet (50 mg total) by mouth every 6 (six) hours as needed for moderate pain or severe pain.   vitamin B-12 (CYANOCOBALAMIN) 1000 MCG tablet Take 1,000 mcg by mouth daily.   Vitamin D, Ergocalciferol, (DRISDOL) 1.25 MG (50000 UNIT) CAPS capsule Take 1 capsule (50,000 Units total) by mouth every 7 (seven) days.   No facility-administered encounter medications on file as of 06/15/2022.    Past Medical History:  Diagnosis Date   Arthritis     Past Surgical History:  Procedure Laterality Date   APPENDECTOMY     CHOLECYSTECTOMY     GASTRIC BYPASS      Family History  Problem Relation Age of Onset   Lupus Sister     Social History   Socioeconomic History   Marital status: Divorced    Spouse name: Not on file   Number of children: Not on file   Years of education: Not on file   Highest education level: Not on file  Occupational History   Not on file  Tobacco Use   Smoking status: Former    Packs/day: 0.50    Years: 20.00    Total pack years: 10.00    Types: Cigarettes   Smokeless tobacco: Never  Vaping Use   Vaping Use: Some days   Substances: Flavoring   Substance and Sexual Activity   Alcohol use: Yes    Alcohol/week: 1.0 standard drink of alcohol    Types: 1 Glasses of wine per week   Drug use: Not on file   Sexual activity: Never    Birth control/protection: Abstinence  Other Topics Concern   Not on file  Social History Narrative   Not on file   Social Determinants of Health   Financial Resource Strain: Not on file  Food Insecurity: Not on file  Transportation Needs: Not on file  Physical Activity: Not on file  Stress: Not on file  Social Connections: Not on file  Intimate Partner Violence: Not on file    ROS      Objective    BP 118/74   Pulse 73   Temp 98.1 F (36.7 C) (Oral)   Resp 16   Wt (!) 313 lb (142 kg)   SpO2 95%   BMI 50.52 kg/m   Physical Exam  {Labs (Optional):23779}    Assessment & Plan:   Problem List Items Addressed This Visit   None   No follow-ups on file.   Tommie Raymond, MD

## 2022-08-04 ENCOUNTER — Encounter: Payer: Medicare (Managed Care) | Admitting: Physical Medicine & Rehabilitation

## 2022-08-16 LAB — HM MAMMOGRAPHY

## 2022-09-05 ENCOUNTER — Other Ambulatory Visit: Payer: Self-pay | Admitting: Family Medicine

## 2022-09-12 ENCOUNTER — Other Ambulatory Visit: Payer: Self-pay | Admitting: *Deleted

## 2022-09-13 ENCOUNTER — Ambulatory Visit: Payer: Medicare (Managed Care) | Admitting: Family Medicine

## 2022-09-13 ENCOUNTER — Encounter: Payer: Self-pay | Admitting: Family Medicine

## 2022-09-13 DIAGNOSIS — M1612 Unilateral primary osteoarthritis, left hip: Secondary | ICD-10-CM | POA: Diagnosis not present

## 2022-09-13 DIAGNOSIS — Z0000 Encounter for general adult medical examination without abnormal findings: Secondary | ICD-10-CM | POA: Diagnosis not present

## 2022-09-13 DIAGNOSIS — Z13820 Encounter for screening for osteoporosis: Secondary | ICD-10-CM

## 2022-09-13 DIAGNOSIS — Z780 Asymptomatic menopausal state: Secondary | ICD-10-CM

## 2022-09-13 DIAGNOSIS — Z23 Encounter for immunization: Secondary | ICD-10-CM

## 2022-09-13 MED ORDER — TRAMADOL HCL 50 MG PO TABS: 50.0000 mg | ORAL_TABLET | Freq: Four times a day (QID) | ORAL | 0 refills | Status: DC | PRN

## 2022-09-13 MED ORDER — TRIAMCINOLONE ACETONIDE 40 MG/ML IJ SUSP
40.0000 mg | Freq: Once | INTRAMUSCULAR | Status: AC
  Administered 2022-09-13: 40 mg via INTRAMUSCULAR

## 2022-09-13 NOTE — Progress Notes (Signed)
Established Patient Office Visit  Subjective    Patient ID: [name] Ray, female    DOB: 04-14-1956  Age: 66 y.o. MRN: [id number]  CC:  Chief Complaint  Patient presents with   Follow-up    HPI [name] Ray presents for follow up of left pain. She desires a kenalog injection.    Outpatient Encounter Medications as of 09/13/2022  Medication Sig   cholecalciferol (VITAMIN D3) 25 MCG (1000 UNIT) tablet Take 1,000 Units by mouth daily.   Coenzyme Q10 (COQ10) 100 MG CAPS Take 1 capsule by mouth daily.   ferrous sulfate 325 (65 FE) MG tablet Take 325 mg by mouth daily with breakfast.   lidocaine (LIDODERM) 5 % Place 1 patch onto the skin daily. Remove & Discard patch within 12 hours or as directed by MD   metFORMIN (GLUCOPHAGE) 500 MG tablet Take 1 tablet (500 mg total) by mouth daily with breakfast.   vitamin B-12 (CYANOCOBALAMIN) 1000 MCG tablet Take 1,000 mcg by mouth daily.   Vitamin D, Ergocalciferol, (DRISDOL) 1.25 MG (50000 UNIT) CAPS capsule TAKE 1 CAPSULE BY MOUTH EVERY 7 DAYS   [DISCONTINUED] traMADol (ULTRAM) 50 MG tablet Take 1 tablet (50 mg total) by mouth every 6 (six) hours as needed for moderate pain or severe pain.   traMADol (ULTRAM) 50 MG tablet Take 1 tablet (50 mg total) by mouth every 6 (six) hours as needed for moderate pain or severe pain.   Facility-Administered Encounter Medications as of 09/13/2022  Medication   triamcinolone acetonide (KENALOG-40) injection 40 mg    Past Medical History:  Diagnosis Date   Arthritis     Past Surgical History:  Procedure Laterality Date   APPENDECTOMY     CHOLECYSTECTOMY     GASTRIC BYPASS      Family History  Problem Relation Age of Onset   Lupus Sister     Social History   Socioeconomic History   Marital status: Divorced    Spouse name: Not on file   Number of children: Not on file   Years of education: Not on file   Highest education level: Not on file  Occupational History   Not on  file  Tobacco Use   Smoking status: Former    Packs/day: 0.50    Years: 20.00    Total pack years: 10.00    Types: Cigarettes   Smokeless tobacco: Never  Vaping Use   Vaping Use: Some days   Substances: Flavoring  Substance and Sexual Activity   Alcohol use: Yes    Alcohol/week: 1.0 standard drink of alcohol    Types: 1 Glasses of wine per week   Drug use: Not on file   Sexual activity: Never    Birth control/protection: Abstinence  Other Topics Concern   Not on file  Social History Narrative   Not on file   Social Determinants of Health   Financial Resource Strain: Not on file  Food Insecurity: Not on file  Transportation Needs: Not on file  Physical Activity: Not on file  Stress: Not on file  Social Connections: Not on file  Intimate Partner Violence: Not on file    Review of Systems  All other systems reviewed and are negative.       Objective    There were no vitals taken for this visit.  Physical Exam Vitals and nursing note reviewed.  Constitutional:      General: She is not in acute distress. Cardiovascular:     Rate  and Rhythm: Normal rate and regular rhythm.  Pulmonary:     Effort: Pulmonary effort is normal.     Breath sounds: Normal breath sounds.  Abdominal:     Palpations: Abdomen is soft.     Tenderness: There is no abdominal tenderness.  Musculoskeletal:     Left hip: Tenderness present. Decreased range of motion.  Neurological:     General: No focal deficit present.     Mental Status: She is alert and oriented to person, place, and time.         Assessment & Plan:   1. Osteoarthritis of left hip, unspecified osteoarthritis type Kenalog injection given. Tramadol refilled.  - DG Bone Density; Future - triamcinolone acetonide (KENALOG-40) injection 40 mg  2. Encounter for Medicare annual wellness exam   3. Encounter for osteoporosis screening in asymptomatic postmenopausal patient Bone density referral  4. Need for  immunization against influenza  - Flu Vaccine QUAD 27mo+IM (Fluarix, Fluzone & Alfiuria Quad PF)  5. Need for pneumococcal vaccination  - Pneumococcal conjugate vaccine 20-valent    Return in 1 year (on 09/14/2023).   [name] Raymond, MD

## 2022-09-13 NOTE — Progress Notes (Signed)
Patient is here for their 3 month follow-up Patient has no concerns today Care gaps have been discussed with patient  

## 2022-10-18 ENCOUNTER — Encounter: Payer: Self-pay | Admitting: Family Medicine

## 2022-12-04 ENCOUNTER — Other Ambulatory Visit: Payer: Self-pay | Admitting: Family Medicine

## 2023-01-10 ENCOUNTER — Ambulatory Visit: Payer: Medicare (Managed Care) | Admitting: Family Medicine

## 2023-03-21 ENCOUNTER — Ambulatory Visit
Admission: RE | Admit: 2023-03-21 | Discharge: 2023-03-21 | Disposition: A | Discharge status: Home / Self Care | Payer: Medicare (Managed Care) | Source: Ambulatory Visit | Attending: Family Medicine | Admitting: Family Medicine

## 2023-03-21 DIAGNOSIS — M1612 Unilateral primary osteoarthritis, left hip: Secondary | ICD-10-CM

## 2023-05-25 ENCOUNTER — Telehealth: Payer: Self-pay

## 2023-05-25 NOTE — Telephone Encounter (Signed)
LVM for patient to call back [phone], or to call PCP office to schedule follow up apt. AS, CMA  

## 2024-03-14 ENCOUNTER — Encounter: Payer: Medicare (Managed Care) | Admitting: Family Medicine

## 2024-05-21 ENCOUNTER — Encounter: Payer: Self-pay | Admitting: Family Medicine

## 2024-05-21 ENCOUNTER — Ambulatory Visit: Payer: Medicare (Managed Care) | Admitting: Family Medicine

## 2024-05-21 VITALS — BP 124/80 | HR 68 | Ht 66.0 in | Wt 243.6 lb

## 2024-05-21 DIAGNOSIS — Z130 Encounter for screening for diseases of the blood and blood-forming organs and certain disorders involving the immune mechanism: Secondary | ICD-10-CM | POA: Diagnosis not present

## 2024-05-21 DIAGNOSIS — Z13220 Encounter for screening for lipoid disorders: Secondary | ICD-10-CM

## 2024-05-21 DIAGNOSIS — Z23 Encounter for immunization: Secondary | ICD-10-CM

## 2024-05-21 DIAGNOSIS — Z0000 Encounter for general adult medical examination without abnormal findings: Secondary | ICD-10-CM

## 2024-05-21 DIAGNOSIS — Z13228 Encounter for screening for other metabolic disorders: Secondary | ICD-10-CM | POA: Diagnosis not present

## 2024-05-21 DIAGNOSIS — Z1329 Encounter for screening for other suspected endocrine disorder: Secondary | ICD-10-CM | POA: Diagnosis not present

## 2024-05-21 DIAGNOSIS — Z1211 Encounter for screening for malignant neoplasm of colon: Secondary | ICD-10-CM

## 2024-05-21 NOTE — Progress Notes (Unsigned)
 Established Patient Office Visit  Subjective    Patient ID: [name] Ray, female    DOB: Nov 20, 1955  Age: 68 y.o. MRN: [id number]  CC:  Chief Complaint  Patient presents with   Annual Exam    Needs vaccines and labs  Needs refill on tramadol , interested in getting a gel prescribed to her for fungus on feet     HPI [name] Ray presents for routine annual exam. Patient denies acute complaints.   Outpatient Encounter Medications as of 05/21/2024  Medication Sig   cholecalciferol (VITAMIN D3) 25 MCG (1000 UNIT) tablet Take 1,000 Units by mouth daily.   vitamin B-12 (CYANOCOBALAMIN) 1000 MCG tablet Take 1,000 mcg by mouth daily.   Vitamin D , Ergocalciferol , (DRISDOL ) 1.25 MG (50000 UNIT) CAPS capsule TAKE 1 CAPSULE BY MOUTH EVERY 7 DAYS   [DISCONTINUED] traMADol  (ULTRAM ) 50 MG tablet Take 1 tablet (50 mg total) by mouth every 6 (six) hours as needed for moderate pain or severe pain.   Coenzyme Q10 (COQ10) 100 MG CAPS Take 1 capsule by mouth daily.   ferrous sulfate 325 (65 FE) MG tablet Take 325 mg by mouth daily with breakfast.   lidocaine  (LIDODERM ) 5 % Place 1 patch onto the skin daily. Remove & Discard patch within 12 hours or as directed by MD   metFORMIN  (GLUCOPHAGE ) 500 MG tablet Take 1 tablet (500 mg total) by mouth daily with breakfast. (Patient not taking: Reported on 05/21/2024)   traMADol  (ULTRAM ) 50 MG tablet Take 1 tablet (50 mg total) by mouth every 6 (six) hours as needed for moderate pain (pain score 4-6) or severe pain (pain score 7-10).   No facility-administered encounter medications on file as of 05/21/2024.    Past Medical History:  Diagnosis Date   Arthritis     Past Surgical History:  Procedure Laterality Date   APPENDECTOMY     CHOLECYSTECTOMY     GASTRIC BYPASS      Family History  Problem Relation Age of Onset   Lupus Sister     Social History   Socioeconomic History   Marital status: Divorced    Spouse name: Not on file    Number of children: Not on file   Years of education: Not on file   Highest education level: Not on file  Occupational History   Not on file  Tobacco Use   Smoking status: Former    Current packs/day: 0.50    Average packs/day: 0.5 packs/day for 20.0 years (10.0 ttl pk-yrs)    Types: Cigarettes   Smokeless tobacco: Never  Vaping Use   Vaping status: Some Days   Substances: Flavoring  Substance and Sexual Activity   Alcohol use: Yes    Alcohol/week: 1.0 standard drink of alcohol    Types: 1 Glasses of wine per week   Drug use: Not on file   Sexual activity: Never    Birth control/protection: Abstinence  Other Topics Concern   Not on file  Social History Narrative   Not on file   Social Drivers of Health   Financial Resource Strain: Not on file  Food Insecurity: Not on file  Transportation Needs: Not on file  Physical Activity: Not on file  Stress: Not on file  Social Connections: Not on file  Intimate Partner Violence: Not on file    Review of Systems  All other systems reviewed and are negative.       Objective    BP 124/80   Pulse 68  Ht 5' 6 (1.676 m)   Wt 243 lb 9.6 oz (110.5 kg)   SpO2 97%   BMI 39.32 kg/m   Physical Exam Vitals and nursing note reviewed.  Constitutional:      General: She is not in acute distress.    Appearance: She is obese.  HENT:     Head: Normocephalic and atraumatic.     Right Ear: Tympanic membrane, ear canal and external ear normal.     Left Ear: Tympanic membrane, ear canal and external ear normal.     Nose: Nose normal.     Mouth/Throat:     Mouth: Mucous membranes are moist.     Pharynx: Oropharynx is clear.  Eyes:     Conjunctiva/sclera: Conjunctivae normal.     Pupils: Pupils are equal, round, and reactive to light.  Neck:     Thyroid: No thyromegaly.  Cardiovascular:     Rate and Rhythm: Normal rate and regular rhythm.     Heart sounds: Normal heart sounds. No murmur heard. Pulmonary:     Effort:  Pulmonary effort is normal. No respiratory distress.     Breath sounds: Normal breath sounds.  Abdominal:     General: There is no distension.     Palpations: Abdomen is soft. There is no mass.     Tenderness: There is no abdominal tenderness.  Musculoskeletal:        General: Normal range of motion.     Cervical back: Normal range of motion and neck supple.  Skin:    General: Skin is warm and dry.  Neurological:     General: No focal deficit present.     Mental Status: She is alert and oriented to person, place, and time.  Psychiatric:        Mood and Affect: Mood normal.        Behavior: Behavior normal.         Assessment & Plan:   Annual physical exam -     CMP14+EGFR  Screening for colon cancer -     Cologuard  Screening for endocrine/metabolic/immunity disorders -     VITAMIN D  25 Hydroxy (Vit-D Deficiency, Fractures) -     Hemoglobin A1c -     TSH  Screening for lipid disorders -     Lipid panel  Screening for deficiency anemia -     CBC with Differential/Platelet  Immunization due -     Varicella-zoster vaccine IM  Other orders -     traMADol  HCl; Take 1 tablet (50 mg total) by mouth every 6 (six) hours as needed for moderate pain (pain score 4-6) or severe pain (pain score 7-10).  Dispense: 30 tablet; Refill: 0     No follow-ups on file.   Tanda Raguel SQUIBB, MD

## 2024-05-22 ENCOUNTER — Ambulatory Visit: Payer: Self-pay | Admitting: Family Medicine

## 2024-05-22 ENCOUNTER — Encounter: Payer: Self-pay | Admitting: Family Medicine

## 2024-05-22 DIAGNOSIS — R195 Other fecal abnormalities: Secondary | ICD-10-CM

## 2024-05-22 LAB — HEMOGLOBIN A1C
Est. average glucose Bld gHb Est-mCnc: 103 mg/dL
Hgb A1c MFr Bld: 5.2 % (ref 4.8–5.6)

## 2024-05-22 LAB — CBC WITH DIFFERENTIAL/PLATELET
Basophils Absolute: 0.1 x10E3/uL (ref 0.0–0.2)
Basos: 1 %
EOS (ABSOLUTE): 0.4 x10E3/uL (ref 0.0–0.4)
Eos: 4 %
Hematocrit: 38 % (ref 34.0–46.6)
Hemoglobin: 12.2 g/dL (ref 11.1–15.9)
Immature Grans (Abs): 0 x10E3/uL (ref 0.0–0.1)
Immature Granulocytes: 0 %
Lymphocytes Absolute: 3.8 x10E3/uL — ABNORMAL HIGH (ref 0.7–3.1)
Lymphs: 41 %
MCH: 29.6 pg (ref 26.6–33.0)
MCHC: 32.1 g/dL (ref 31.5–35.7)
MCV: 92 fL (ref 79–97)
Monocytes Absolute: 0.6 x10E3/uL (ref 0.1–0.9)
Monocytes: 7 %
Neutrophils Absolute: 4.4 x10E3/uL (ref 1.4–7.0)
Neutrophils: 47 %
Platelets: 273 x10E3/uL (ref 150–450)
RBC: 4.12 x10E6/uL (ref 3.77–5.28)
RDW: 12.8 % (ref 11.7–15.4)
WBC: 9.2 x10E3/uL (ref 3.4–10.8)

## 2024-05-22 LAB — CMP14+EGFR
ALT: 14 IU/L (ref 0–32)
AST: 17 IU/L (ref 0–40)
Albumin: 4.2 g/dL (ref 3.9–4.9)
Alkaline Phosphatase: 98 IU/L (ref 44–121)
BUN/Creatinine Ratio: 16 (ref 12–28)
BUN: 12 mg/dL (ref 8–27)
Bilirubin Total: 0.3 mg/dL (ref 0.0–1.2)
CO2: 24 mmol/L (ref 20–29)
Calcium: 9.7 mg/dL (ref 8.7–10.3)
Chloride: 101 mmol/L (ref 96–106)
Creatinine, Ser: 0.74 mg/dL (ref 0.57–1.00)
Globulin, Total: 2.5 g/dL (ref 1.5–4.5)
Glucose: 73 mg/dL (ref 70–99)
Potassium: 4.5 mmol/L (ref 3.5–5.2)
Sodium: 140 mmol/L (ref 134–144)
Total Protein: 6.7 g/dL (ref 6.0–8.5)
eGFR: 88 mL/min/1.73 (ref >59)

## 2024-05-22 LAB — TSH: TSH: 2.38 u[IU]/mL (ref 0.450–4.500)

## 2024-05-22 LAB — LIPID PANEL
Chol/HDL Ratio: 2.6 ratio (ref 0.0–4.4)
Cholesterol, Total: 178 mg/dL (ref 100–199)
HDL: 69 mg/dL (ref >39)
LDL Chol Calc (NIH): 98 mg/dL (ref 0–99)
Triglycerides: 55 mg/dL (ref 0–149)
VLDL Cholesterol Cal: 11 mg/dL (ref 5–40)

## 2024-05-22 LAB — VITAMIN D 25 HYDROXY (VIT D DEFICIENCY, FRACTURES): Vit D, 25-Hydroxy: 49.5 ng/mL (ref 30.0–100.0)

## 2024-05-22 MED ORDER — TRAMADOL HCL 50 MG PO TABS: 50.0000 mg | ORAL_TABLET | Freq: Four times a day (QID) | ORAL | 0 refills | Status: DC | PRN

## 2024-06-15 LAB — COLOGUARD: COLOGUARD: POSITIVE — AB

## 2024-06-17 ENCOUNTER — Other Ambulatory Visit: Payer: Self-pay | Admitting: Family Medicine

## 2024-06-17 ENCOUNTER — Encounter: Payer: Self-pay | Admitting: Gastroenterology

## 2024-06-17 NOTE — Telephone Encounter (Signed)
 Copied from CRM #[id number]. Topic: Clinical - Medication Refill >> Jun 17, 2024 12:47 PM Turkey B wrote: Medication: traMADol  (ULTRAM ) 50 MG tablet   Has the patient contacted their pharmacy? no Thought Dr Tanda was sending it in last month at there appt  This is the patient's preferred pharmacy:  [id number] [place], [place] - [address] AT [facility] [address] [place] [address] Phone: [phone] Fax: [phone]  Is this the correct pharmacy for this prescription? yes   Has the prescription been filled recently? no  Is the patient out of the medication? yes  Has the patient been seen for an appointment in the last year OR does the patient have an upcoming appointment? yes  Can we respond through MyChart? yes  Agent: Please be advised that Rx refills may take up to 3 business days. We ask that you follow-up with your pharmacy.

## 2024-06-18 NOTE — Telephone Encounter (Signed)
 Requested medication (s) are due for refill today - yes  Requested medication (s) are on the active medication list -yes  Future visit scheduled -no  Last refill: 05/22/24 #30  Notes to clinic: non delegated Rx  Requested Prescriptions  Pending Prescriptions Disp Refills   traMADol  (ULTRAM ) 50 MG tablet 30 tablet 0    Sig: Take 1 tablet (50 mg total) by mouth every 6 (six) hours as needed for moderate pain (pain score 4-6) or severe pain (pain score 7-10).     Not Delegated - Analgesics:  Opioid Agonists Failed - 06/18/2024  4:10 PM      Failed - This refill cannot be delegated      Failed - Urine Drug Screen completed in last 360 days      Passed - Valid encounter within last 3 months    Recent Outpatient Visits           4 weeks ago Annual physical exam   [facility], MD   1 year ago Osteoarthritis of left hip, unspecified osteoarthritis type   [place] [facility], Raguel, MD   2 years ago Osteoarthritis of left hip, unspecified osteoarthritis type   [place] [facility], MD   2 years ago Annual physical exam   [place] [facility], MD   3 years ago Annual physical exam   [place] [facility], Dorothyann Maxwell, DO                 Requested Prescriptions  Pending Prescriptions Disp Refills   traMADol  (ULTRAM ) 50 MG tablet 30 tablet 0    Sig: Take 1 tablet (50 mg total) by mouth every 6 (six) hours as needed for moderate pain (pain score 4-6) or severe pain (pain score 7-10).     Not Delegated - Analgesics:  Opioid Agonists Failed - 06/18/2024  4:10 PM      Failed - This refill cannot be delegated      Failed - Urine Drug Screen completed in last 360 days      Passed - Valid encounter within last 3 months    Recent Outpatient Visits           4 weeks ago Annual physical exam   [facility], MD   1 year ago Osteoarthritis of left hip, unspecified osteoarthritis type   [place] [facility], MD   2 years ago Osteoarthritis of left hip, unspecified osteoarthritis type   [place] [facility], MD   2 years ago Annual physical exam   [place] [facility], MD   3 years ago Annual physical exam   [facility], Dorothyann Maxwell, DO

## 2024-07-23 ENCOUNTER — Encounter: Payer: Self-pay | Admitting: Gastroenterology

## 2024-07-23 ENCOUNTER — Ambulatory Visit (AMBULATORY_SURGERY_CENTER): Payer: Medicare (Managed Care)

## 2024-07-23 VITALS — Ht 66.0 in | Wt 240.0 lb

## 2024-07-23 DIAGNOSIS — Z1211 Encounter for screening for malignant neoplasm of colon: Secondary | ICD-10-CM

## 2024-07-23 MED ORDER — NA SULFATE-K SULFATE-MG SULF 17.5-3.13-1.6 GM/177ML PO SOLN: 1.0000 | Freq: Once | ORAL | 0 refills | Status: AC

## 2024-07-23 NOTE — Progress Notes (Signed)
 Pre visit completed via phone call; Patient verified name, DOB, and address; No egg or soy allergy known to patient;  No issues known to pt with past sedation with any surgeries or procedures; Patient denies ever being told they had issues or difficulty with intubation;  No FH of Malignant Hyperthermia; Pt is not on diet pills; Pt is not on home 02;  Pt is not on blood thinners;  Pt denies issues with constipation  No A fib or A flutter; Have any cardiac testing pending--NO Insurance verified during PV appt--- [employer] Medicare Pt can ambulate without assistance;  Pt denies use of chewing tobacco; Discussed diabetic/weight loss medication holds; Discussed NSAID holds; Checked BMI to be less than 50; Pt instructed to use Singlecare.com or GoodRx for a price reduction on prep;  Patient's chart reviewed by Norleen Schillings CNRA prior to previsit and patient appropriate for the [facility]; Pre visit completed and red dot placed by patient's name on their procedure day (on provider's schedule); Instructions sent to MyChart per patient request;

## 2024-07-30 ENCOUNTER — Ambulatory Visit (AMBULATORY_SURGERY_CENTER): Payer: Medicare (Managed Care) | Admitting: Gastroenterology

## 2024-07-30 ENCOUNTER — Encounter: Payer: Self-pay | Admitting: Gastroenterology

## 2024-07-30 VITALS — BP 124/76 | HR 54 | Temp 97.1°F | Resp 13 | Ht 66.0 in | Wt 240.0 lb

## 2024-07-30 DIAGNOSIS — Z1211 Encounter for screening for malignant neoplasm of colon: Secondary | ICD-10-CM | POA: Diagnosis not present

## 2024-07-30 DIAGNOSIS — K5730 Diverticulosis of large intestine without perforation or abscess without bleeding: Secondary | ICD-10-CM | POA: Diagnosis not present

## 2024-07-30 DIAGNOSIS — K621 Rectal polyp: Secondary | ICD-10-CM | POA: Diagnosis not present

## 2024-07-30 DIAGNOSIS — D128 Benign neoplasm of rectum: Secondary | ICD-10-CM

## 2024-07-30 MED ORDER — SODIUM CHLORIDE 0.9 % IV SOLN: 500.0000 mL | Freq: Once | INTRAVENOUS | Status: DC

## 2024-07-30 NOTE — Patient Instructions (Signed)

## 2024-07-30 NOTE — Progress Notes (Signed)
 Called to room to assist during endoscopic procedure.  Patient ID and intended procedure confirmed with present staff. Received instructions for my participation in the procedure from the performing physician.

## 2024-07-30 NOTE — Progress Notes (Signed)
 Pt's states no medical or surgical changes since previsit or office visit.

## 2024-07-30 NOTE — Progress Notes (Signed)
 Sedate, gd SR, tolerated procedure well, VSS, report to RN

## 2024-07-30 NOTE — Op Note (Signed)
 [facility] Patient Name: [name] Ray Procedure Date: 07/30/2024 10:09 AM MRN: [id number] Endoscopist: Glendia E. Stacia , MD, [id number] Age: 68 Referring MD:  Date of Birth: 1956-01-30 Gender: Female Account #: [id number] Procedure:                Colonoscopy Indications:              Screening for colorectal malignant neoplasm (last                            colonoscopy was 10 years ago) Medicines:                Monitored Anesthesia Care Procedure:                Pre-Anesthesia Assessment:                           - Prior to the procedure, a History and Physical                            was performed, and patient medications and                            allergies were reviewed. The patient's tolerance of                            previous anesthesia was also reviewed. The risks                            and benefits of the procedure and the sedation                            options and risks were discussed with the patient.                            All questions were answered, and informed consent                            was obtained. Prior Anticoagulants: The patient has                            taken no anticoagulant or antiplatelet agents. ASA                            Grade Assessment: II - A patient with mild systemic                            disease. After reviewing the risks and benefits,                            the patient was deemed in satisfactory condition to                            undergo the procedure.  After obtaining informed consent, the colonoscope                            was passed under direct vision. Throughout the                            procedure, the patient's blood pressure, pulse, and                            oxygen saturations were monitored continuously. The                            Olympus Scope [id number] was introduced through the                            anus and advanced  to the the cecum, identified by                            appendiceal orifice and ileocecal valve. The                            colonoscopy was performed without difficulty. The                            patient tolerated the procedure well. The quality                            of the bowel preparation was adequate. The                            ileocecal valve, appendiceal orifice, and rectum                            were photographed. The bowel preparation used was                            SUPREP via split dose instruction. Scope In: 10:23:52 AM Scope Out: 10:49:40 AM Scope Withdrawal Time: 0 hours 16 minutes 14 seconds  Total Procedure Duration: 0 hours 25 minutes 48 seconds  Findings:                 The perianal and digital rectal examinations were                            normal. Pertinent negatives include normal                            sphincter tone and no palpable rectal lesions.                           A 5 mm polyp was found in the rectum. The polyp was                            sessile. The polyp was removed with  a cold snare.                            Resection and retrieval were complete. Estimated                            blood loss was minimal.                           Multiple medium-mouthed and small-mouthed                            diverticula were found in the sigmoid colon,                            descending colon, transverse colon and ascending                            colon.                           The exam was otherwise normal throughout the                            examined colon.                           The retroflexed view of the distal rectum and anal                            verge was normal and showed no anal or rectal                            abnormalities. Complications:            No immediate complications. Estimated Blood Loss:     Estimated blood loss was minimal. Impression:               - One 5 mm polyp in  the rectum, removed with a cold                            snare. Resected and retrieved.                           - Moderate diverticulosis in the sigmoid colon, in                            the descending colon, in the transverse colon and                            in the ascending colon.                           - The distal rectum and anal verge are normal on  retroflexion view. Recommendation:           - Patient has a contact number available for                            emergencies. The signs and symptoms of potential                            delayed complications were discussed with the                            patient. Return to normal activities tomorrow.                            Written discharge instructions were provided to the                            patient.                           - Resume previous diet.                           - Continue present medications.                           - Await pathology results.                           - Repeat colonoscopy (date not yet determined) for                            surveillance based on pathology results. Josepha Barbier E. Stacia, MD 07/30/2024 10:56:26 AM This report has been signed electronically.

## 2024-07-30 NOTE — Progress Notes (Signed)
 [facility] History and Physical   Primary Care Physician:  Tanda Bleacher, MD   Reason for Procedure:   Colon cancer screening  Plan:    Screening colonoscopy     HPI: [name] Ray is a 68 y.o. female undergoing average risk screening colonoscopy.  She has no family history of colon cancer and no chronic GI symptoms.  She had a colonoscopy in 2015 in which a sigmoid hyperplastic polyp was removed.  Past Medical History:  Diagnosis Date   Arthritis     Past Surgical History:  Procedure Laterality Date   APPENDECTOMY     CHOLECYSTECTOMY     GASTRIC BYPASS  2007    Prior to Admission medications   Medication Sig Start Date End Date Taking? Authorizing Provider  Coenzyme Q10 (COQ10) 100 MG CAPS Take 1 capsule by mouth daily.   Yes [provider]  vitamin B-12 (CYANOCOBALAMIN) 1000 MCG tablet Take 1,000 mcg by mouth daily.   Yes [provider]    Current Outpatient Medications  Medication Sig Dispense Refill   Coenzyme Q10 (COQ10) 100 MG CAPS Take 1 capsule by mouth daily.     vitamin B-12 (CYANOCOBALAMIN) 1000 MCG tablet Take 1,000 mcg by mouth daily.     Current Facility-Administered Medications  Medication Dose Route Frequency Provider Last Rate Last Admin   0.9 %  sodium chloride infusion  500 mL Intravenous Once Stacia Glendia BRAVO, MD        Allergies as of 07/30/2024 - Review Complete 07/30/2024  Allergen Reaction Noted   Almond (diagnostic) Itching 05/07/2020   Cashew nut (anacardium occidentale) skin test Itching 05/07/2020    Family History  Problem Relation Age of Onset   Lupus Sister    Colon cancer Neg Hx    Colon polyps Neg Hx    Esophageal cancer Neg Hx    Stomach cancer Neg Hx    Rectal cancer Neg Hx     Social History   Socioeconomic History   Marital status: Divorced    Spouse name: Not on file   Number of children: Not on file   Years of education: Not on file   Highest education level: Not on  file  Occupational History   Not on file  Tobacco Use   Smoking status: Former    Current packs/day: 0.50    Average packs/day: 0.5 packs/day for 20.0 years (10.0 ttl pk-yrs)    Types: Cigarettes   Smokeless tobacco: Never  Vaping Use   Vaping status: Some Days   Substances: Flavoring  Substance and Sexual Activity   Alcohol use: Yes    Alcohol/week: 4.0 standard drinks of alcohol    Types: 4 Standard drinks or equivalent per week   Drug use: Never   Sexual activity: Never    Birth control/protection: Abstinence  Other Topics Concern   Not on file  Social History Narrative   Not on file   Social Drivers of Health   Financial Resource Strain: Not on file  Food Insecurity: Not on file  Transportation Needs: Not on file  Physical Activity: Not on file  Stress: Not on file  Social Connections: Not on file  Intimate Partner Violence: Not on file    Review of Systems:  All other review of systems negative except as mentioned in the HPI.  Physical Exam: Vital signs BP 126/61   Pulse 66   Temp (!) 97.1 F (36.2 C) (Temporal)   Ht 5' 6 (1.676 m)   Wt  240 lb (108.9 kg)   SpO2 98%   BMI 38.74 kg/m   General:   Alert,  Well-developed, well-nourished, pleasant and cooperative in NAD Airway:  Mallampati 2 Lungs:  Clear throughout to auscultation.   Heart:  Regular rate and rhythm; no murmurs, clicks, rubs,  or gallops. Abdomen:  Soft, nontender and nondistended. Normal bowel sounds.   Neuro/Psych:  Normal mood and affect. A and O x 3   Devika Dragovich E. Stacia, MD [facility]

## 2024-07-31 ENCOUNTER — Telehealth: Payer: Self-pay | Admitting: *Deleted

## 2024-07-31 NOTE — Telephone Encounter (Signed)
  Follow up Call-     07/30/2024    9:28 AM  Call back number  Post procedure Call Back phone  # [phone]  Permission to leave phone message Yes     Patient questions:  Do you have a fever, pain , or abdominal swelling? No. Pain Score  0 *  Have you tolerated food without any problems? Yes.    Have you been able to return to your normal activities? Yes.    Do you have any questions about your discharge instructions: Diet   No. Medications  No. Follow up visit  No.  Do you have questions or concerns about your Care? No.  Actions: * If pain score is 4 or above: No action needed, pain <4.

## 2024-08-01 ENCOUNTER — Ambulatory Visit: Payer: Self-pay | Admitting: Gastroenterology

## 2024-08-01 LAB — SURGICAL PATHOLOGY

## 2024-08-01 NOTE — Progress Notes (Signed)
 Ms. Bordner, Good news: the polyp (or polyps) that I removed during your recent examination were NOT precancerous.  You should continue to follow current colorectal cancer screening guidelines with a repeat colonoscopy in 10 years.    Because colon cancer screening after age 69 is done on a case-by-case basis, taking into account the patient's risk factors for colon cancer, as well as comorbidities and life expectancy, I would recommend you make an appointment with me in 10 years to discuss the risks/benefits of further colon cancer screening if that is something you would like to consider.

## 2024-11-21 ENCOUNTER — Ambulatory Visit (INDEPENDENT_AMBULATORY_CARE_PROVIDER_SITE_OTHER): Payer: Medicare (Managed Care)

## 2024-11-21 VITALS — BP 138/80 | HR 83 | Ht 69.0 in | Wt 243.8 lb

## 2024-11-21 DIAGNOSIS — Z0000 Encounter for general adult medical examination without abnormal findings: Secondary | ICD-10-CM

## 2024-11-21 DIAGNOSIS — Z1231 Encounter for screening mammogram for malignant neoplasm of breast: Secondary | ICD-10-CM | POA: Diagnosis not present

## 2024-11-21 NOTE — Progress Notes (Addendum)
 "  Chief Complaint  Patient presents with   Medicare Wellness     Subjective:   [name] Ray is a 69 y.o. female who presents for a Medicare Annual Wellness Visit.  Visit info / Clinical Intake: Medicare Wellness Visit Type:: Subsequent Annual Wellness Visit Persons participating in visit and providing information:: patient Medicare Wellness Visit Mode:: In-person (required for WTM) Interpreter Needed?: No Pre-visit prep was completed: yes AWV questionnaire completed by patient prior to visit?: no Living arrangements:: (!) lives alone Patient's Overall Health Status Rating: good Typical amount of pain: none Does pain affect daily life?: no Are you currently prescribed opioids?: no  Dietary Habits and Nutritional Risks How many meals a day?: 2 (1-2) Eats fruit and vegetables daily?: yes Most meals are obtained by: preparing own meals; eating out In the last 2 weeks, have you had any of the following?: none Diabetic:: no  Functional Status Activities of Daily Living (to include ambulation/medication): Independent Ambulation: Independent with device- listed below Home Assistive Devices/Equipment: Dentures (specify type) Medication Administration: Independent Home Management (perform basic housework or laundry): Independent Manage your own finances?: yes Primary transportation is: driving Concerns about vision?: no *vision screening is required for WTM* Concerns about hearing?: no  Fall Screening Falls in the past year?: 0 Number of falls in past year: 0 Was there an injury with Fall?: 0 Fall Risk Category Calculator: 0 Patient Fall Risk Level: Low Fall Risk  Fall Risk Patient at Risk for Falls Due to: No Fall Risks Fall risk Follow up: Falls evaluation completed; Falls prevention discussed  Home and Transportation Safety: All rugs have non-skid backing?: N/A, no rugs All stairs or steps have railings?: yes Grab bars in the bathtub or shower?: (!) no Have  non-skid surface in bathtub or shower?: yes Good home lighting?: yes Regular seat belt use?: yes Hospital stays in the last year:: no  Cognitive Assessment Difficulty concentrating, remembering, or making decisions? : no Will 6CIT or Mini Cog be Completed: yes What year is it?: 0 points What month is it?: 0 points Give patient an address phrase to remember (5 components): [address] [place], [place] About what time is it?: 0 points Count backwards from 20 to 1: 0 points Say the months of the year in reverse: 0 points Repeat the address phrase from earlier: 0 points 6 CIT Score: 0 points  Advance Directives (For Healthcare) Does Patient Have a Medical Advance Directive?: No Would patient like information on creating a medical advance directive?: Yes (MAU/Ambulatory/Procedural Areas - Information given)  Reviewed/Updated  Reviewed/Updated: Reviewed All (Medical, Surgical, Family, Medications, Allergies, Care Teams, Patient Goals)    Allergies (verified) Almond (diagnostic) and Anacardium occidentale   Current Medications (verified) Outpatient Encounter Medications as of 11/21/2024  Medication Sig   Coenzyme Q10 (COQ10) 100 MG CAPS Take 1 capsule by mouth daily.   vitamin B-12 (CYANOCOBALAMIN) 1000 MCG tablet Take 1,000 mcg by mouth daily.   No facility-administered encounter medications on file as of 11/21/2024.    History: Past Medical History:  Diagnosis Date   Arthritis    Past Surgical History:  Procedure Laterality Date   APPENDECTOMY     CHOLECYSTECTOMY     GASTRIC BYPASS  2007   Family History  Problem Relation Age of Onset   Lupus Sister    Colon cancer Neg Hx    Colon polyps Neg Hx    Esophageal cancer Neg Hx    Stomach cancer Neg Hx    Rectal cancer Neg  Hx    Social History   Occupational History   Not on file  Tobacco Use   Smoking status: Former    Current packs/day: 0.50    Average packs/day: 0.7 packs/day for 51.1 years (38.0 ttl pk-yrs)     Types: Cigarettes    Start date: 10/24/1973   Smokeless tobacco: Current   Tobacco comments:    Vaping  Vaping Use   Vaping status: Some Days   Substances: Flavoring  Substance and Sexual Activity   Alcohol use: Yes    Alcohol/week: 5.0 standard drinks of alcohol    Types: 1 Shots of liquor, 4 Standard drinks or equivalent per week   Drug use: Never   Sexual activity: Not Currently    Birth control/protection: Abstinence   Tobacco Counseling Ready to quit: No Counseling given: Yes Tobacco comments: Vaping  SDOH Screenings   Food Insecurity: No Food Insecurity (11/21/2024)  Housing: Unknown (11/21/2024)  Transportation Needs: No Transportation Needs (11/21/2024)  Utilities: Not At Risk (11/21/2024)  Depression (PHQ2-9): Low Risk (11/21/2024)  Physical Activity: Inactive (11/21/2024)  Social Connections: Socially Isolated (11/21/2024)  Stress: No Stress Concern Present (11/21/2024)  Tobacco Use: High Risk (11/21/2024)  Health Literacy: Adequate Health Literacy (11/21/2024)   See flowsheets for full screening details  Depression Screen PHQ 2 & 9 Depression Scale- Over the past 2 weeks, how often have you been bothered by any of the following problems? Little interest or pleasure in doing things: 0 Feeling down, depressed, or hopeless (PHQ Adolescent also includes...irritable): 0 PHQ-2 Total Score: 0 Trouble falling or staying asleep, or sleeping too much: 0 Feeling tired or having little energy: 0 Poor appetite or overeating (PHQ Adolescent also includes...weight loss): 0 Feeling bad about yourself - or that you are a failure or have let yourself or your family down: 0 Trouble concentrating on things, such as reading the newspaper or watching television (PHQ Adolescent also includes...like school work): 0 Moving or speaking so slowly that other people could have noticed. Or the opposite - being so fidgety or restless that you have been moving around a lot more than usual: 0 Thoughts  that you would be better off dead, or of hurting yourself in some way: 0 PHQ-9 Total Score: 0 If you checked off any problems, how difficult have these problems made it for you to do your work, take care of things at home, or get along with other people?: Not difficult at all  Depression Treatment Depression Interventions/Treatment : [id number] Score <4 Follow-up Not Indicated     Goals Addressed               This Visit's Progress     Patient Stated (pt-stated)        Patient stated she plans to continue taking meds and watch sugar intake.             Objective:    Today's Vitals   11/21/24 1257  BP: 138/80  Pulse: 83  SpO2: 98%  Weight: 243 lb 12.8 oz (110.6 kg)  Height: 5' 9 (1.753 m)   Body mass index is 36 kg/m.  Hearing/Vision screen Hearing Screening - Comments:: Denies hearing difficulties   Vision Screening - Comments:: Denies vision concerns - up to date with routine eye exams with [facility] Immunizations and Health Maintenance Health Maintenance  Topic Date Due   COVID-19 Vaccine (1 - 2025-26 season) Never done   Zoster Vaccines- Shingrix  (2 of 2) 07/16/2024   Mammogram  10/25/2024  Medicare Annual Wellness (AWV)  11/21/2025   DTaP/Tdap/Td (2 - Td or Tdap) 01/27/2026   Colonoscopy  07/30/2034   Pneumococcal Vaccine: 50+ Years  Completed   Influenza Vaccine  Completed   Bone Density Scan  Completed   Hepatitis C Screening  Completed   Meningococcal B Vaccine  Aged Out        Assessment/Plan:  This is a routine wellness examination for [name] Ray.  Mammogram status: ordered today  Patient Care Team: Tanda Bleacher, MD as PCP - [facility]  I have personally reviewed and noted the following in the patients chart:   Medical and social history Use of alcohol, tobacco or illicit drugs  Current medications and supplements including opioid prescriptions. Functional ability and status Nutritional  status Physical activity Advanced directives List of other physicians Hospitalizations, surgeries, and ER visits in previous 12 months Vitals Screenings to include cognitive, depression, and falls Referrals and appointments  Orders Placed This Encounter  Procedures   MM 3D SCREENING MAMMOGRAM BILATERAL BREAST    Standing Status:   Future    Expiration Date:   11/21/2025    Reason for Exam (SYMPTOM  OR DIAGNOSIS REQUIRED):   screening for breast cancer    Preferred imaging location?:   [facility]   In addition, I have reviewed and discussed with patient certain preventive protocols, quality metrics, and best practice recommendations. A written personalized care plan for preventive services as well as general preventive health recommendations were provided to patient.   [name] Ray, CMA   11/21/2024   Return in 1 year (on 11/21/2025).  After Visit Summary: (In Person-Declined) Patient declined AVS at this time.  Nurse Notes: scheduled 2027 AWV appt "

## 2024-11-21 NOTE — Patient Instructions (Addendum)
 Ms. Sonnen,  Thank you for taking the time for your Medicare Wellness Visit. I appreciate your continued commitment to your health goals. Please review the care plan we discussed, and feel free to reach out if I can assist you further.  Please note that Annual Wellness Visits do not include a physical exam. Some assessments may be limited, especially if the visit was conducted virtually. If needed, we may recommend an in-person follow-up with your provider.  Ongoing Care Seeing your primary care provider every 3 to 6 months helps us  monitor your health and provide consistent, personalized care.   Referrals If a referral was made during today's visit and you haven't received any updates within two weeks, please contact the referred provider directly to check on the status.  Recommended Screenings:  Health Maintenance  Topic Date Due   COVID-19 Vaccine (1 - 2025-26 season) Never done   Zoster (Shingles) Vaccine (2 of 2) 07/16/2024   Breast Cancer Screening  10/25/2024   Medicare Annual Wellness Visit  11/21/2025   DTaP/Tdap/Td vaccine (2 - Td or Tdap) 01/27/2026   Colon Cancer Screening  07/30/2034   Pneumococcal Vaccine for age over 19  Completed   Flu Shot  Completed   Osteoporosis screening with Bone Density Scan  Completed   Hepatitis C Screening  Completed   Meningitis B Vaccine  Aged Out       01/27/2022   11:58 AM  Advanced Directives  Does Patient Have a Medical Advance Directive? No    Vision: Annual vision screenings are recommended for early detection of glaucoma, cataracts, and diabetic retinopathy. These exams can also reveal signs of chronic conditions such as diabetes and high blood pressure.  Dental: Annual dental screenings help detect early signs of oral cancer, gum disease, and other conditions linked to overall health, including heart disease and diabetes.

## 2025-01-07 ENCOUNTER — Encounter: Payer: Self-pay | Admitting: Family Medicine

## 2025-01-09 ENCOUNTER — Encounter: Payer: Medicare (Managed Care) | Admitting: Family Medicine

## 2025-12-04 ENCOUNTER — Ambulatory Visit: Payer: Self-pay
